# Patient Record
Sex: Male | Born: 1980 | Race: Black or African American | Hispanic: No | Marital: Married | Smoking: Current every day smoker
Health system: Southern US, Community
[De-identification: ages and names within clinical notes are randomized; demographics above are authoritative.]

## PROBLEM LIST (undated history)

## (undated) HISTORY — PX: LUNG SURGERY: SHX703

---
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
## 2002-03-02 ENCOUNTER — Emergency Department (HOSPITAL_COMMUNITY): Admission: EM | Admit: 2002-03-02 | Discharge: 2002-03-02 | Payer: Self-pay | Admitting: Emergency Medicine

## 2002-03-02 ENCOUNTER — Encounter: Payer: Self-pay | Admitting: Emergency Medicine

## 2002-12-04 ENCOUNTER — Emergency Department (HOSPITAL_COMMUNITY): Admission: EM | Admit: 2002-12-04 | Discharge: 2002-12-04 | Payer: Self-pay

## 2002-12-05 ENCOUNTER — Emergency Department (HOSPITAL_COMMUNITY): Admission: EM | Admit: 2002-12-05 | Discharge: 2002-12-06 | Payer: Self-pay | Admitting: Emergency Medicine

## 2003-01-17 ENCOUNTER — Emergency Department (HOSPITAL_COMMUNITY): Admission: AD | Admit: 2003-01-17 | Discharge: 2003-01-17 | Payer: Self-pay | Admitting: Family Medicine

## 2003-04-10 ENCOUNTER — Emergency Department (HOSPITAL_COMMUNITY): Admission: EM | Admit: 2003-04-10 | Discharge: 2003-04-10 | Payer: Self-pay

## 2003-05-05 ENCOUNTER — Emergency Department (HOSPITAL_COMMUNITY): Admission: EM | Admit: 2003-05-05 | Discharge: 2003-05-05 | Payer: Self-pay | Admitting: Emergency Medicine

## 2003-08-04 ENCOUNTER — Emergency Department (HOSPITAL_COMMUNITY): Admission: EM | Admit: 2003-08-04 | Discharge: 2003-08-04 | Payer: Self-pay | Admitting: Emergency Medicine

## 2004-11-25 ENCOUNTER — Emergency Department (HOSPITAL_COMMUNITY): Admission: EM | Admit: 2004-11-25 | Discharge: 2004-11-25 | Payer: Self-pay | Admitting: Emergency Medicine

## 2004-12-11 ENCOUNTER — Emergency Department (HOSPITAL_COMMUNITY): Admission: EM | Admit: 2004-12-11 | Discharge: 2004-12-11 | Payer: Self-pay | Admitting: Emergency Medicine

## 2005-05-21 ENCOUNTER — Emergency Department (HOSPITAL_COMMUNITY): Admission: EM | Admit: 2005-05-21 | Discharge: 2005-05-21 | Payer: Self-pay | Admitting: Emergency Medicine

## 2006-01-19 ENCOUNTER — Emergency Department (HOSPITAL_COMMUNITY): Admission: EM | Admit: 2006-01-19 | Discharge: 2006-01-19 | Payer: Self-pay | Admitting: Emergency Medicine

## 2006-03-25 ENCOUNTER — Emergency Department (HOSPITAL_COMMUNITY): Admission: EM | Admit: 2006-03-25 | Discharge: 2006-03-25 | Payer: Self-pay | Admitting: Emergency Medicine

## 2006-03-27 ENCOUNTER — Emergency Department (HOSPITAL_COMMUNITY): Admission: EM | Admit: 2006-03-27 | Discharge: 2006-03-27 | Payer: Self-pay | Admitting: Emergency Medicine

## 2006-08-22 ENCOUNTER — Emergency Department (HOSPITAL_COMMUNITY): Admission: EM | Admit: 2006-08-22 | Discharge: 2006-08-22 | Payer: Self-pay | Admitting: Emergency Medicine

## 2006-09-01 ENCOUNTER — Emergency Department (HOSPITAL_COMMUNITY): Admission: EM | Admit: 2006-09-01 | Discharge: 2006-09-02 | Payer: Self-pay | Admitting: Emergency Medicine

## 2006-12-02 ENCOUNTER — Emergency Department (HOSPITAL_COMMUNITY): Admission: EM | Admit: 2006-12-02 | Discharge: 2006-12-02 | Payer: Self-pay | Admitting: Emergency Medicine

## 2007-02-05 ENCOUNTER — Emergency Department (HOSPITAL_COMMUNITY): Admission: EM | Admit: 2007-02-05 | Discharge: 2007-02-05 | Payer: Self-pay | Admitting: Emergency Medicine
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
## 2009-02-16 ENCOUNTER — Emergency Department (HOSPITAL_COMMUNITY): Admission: EM | Admit: 2009-02-16 | Discharge: 2009-02-17 | Payer: Self-pay | Admitting: Emergency Medicine

## 2009-03-09 ENCOUNTER — Emergency Department (HOSPITAL_COMMUNITY): Admission: EM | Admit: 2009-03-09 | Discharge: 2009-03-09 | Payer: Self-pay

## 2009-03-22 ENCOUNTER — Encounter: Admission: RE | Admit: 2009-03-22 | Discharge: 2009-03-22 | Payer: Self-pay | Admitting: Chiropractic Medicine

## 2009-07-08 ENCOUNTER — Emergency Department (HOSPITAL_COMMUNITY): Admission: EM | Admit: 2009-07-08 | Discharge: 2009-07-09 | Payer: Self-pay | Admitting: Emergency Medicine

## 2009-10-05 ENCOUNTER — Emergency Department (HOSPITAL_COMMUNITY): Admission: EM | Admit: 2009-10-05 | Discharge: 2009-10-05 | Payer: Self-pay | Admitting: Emergency Medicine

## 2009-10-19 ENCOUNTER — Emergency Department (HOSPITAL_COMMUNITY): Admission: EM | Admit: 2009-10-19 | Discharge: 2009-10-19 | Payer: Self-pay | Admitting: Emergency Medicine

## 2010-02-03 ENCOUNTER — Emergency Department (HOSPITAL_COMMUNITY)
Admission: EM | Admit: 2010-02-03 | Discharge: 2010-02-03 | Payer: Self-pay | Source: Home / Self Care | Admitting: Emergency Medicine

## 2010-02-05 ENCOUNTER — Emergency Department (HOSPITAL_COMMUNITY)
Admission: EM | Admit: 2010-02-05 | Discharge: 2010-02-05 | Payer: Self-pay | Source: Home / Self Care | Admitting: Emergency Medicine

## 2010-02-12 LAB — COMPREHENSIVE METABOLIC PANEL
ALT: 11 U/L (ref 0–53)
AST: 23 U/L (ref 0–37)
Albumin: 5 g/dL (ref 3.5–5.2)
Alkaline Phosphatase: 53 U/L (ref 39–117)
BUN: 8 mg/dL (ref 6–23)
CO2: 27 mEq/L (ref 19–32)
Calcium: 9.6 mg/dL (ref 8.4–10.5)
Chloride: 104 mEq/L (ref 96–112)
Creatinine, Ser: 1.17 mg/dL (ref 0.4–1.5)
GFR calc Af Amer: >60 mL/min (ref >60)
GFR calc non Af Amer: >60 mL/min (ref >60)
Glucose, Bld: 95 mg/dL (ref 70–99)
Potassium: 3.9 mEq/L (ref 3.5–5.1)
Sodium: 139 mEq/L (ref 135–145)
Total Bilirubin: 0.8 mg/dL (ref 0.3–1.2)
Total Protein: 7.9 g/dL (ref 6.0–8.3)

## 2010-02-12 LAB — URINALYSIS, ROUTINE W REFLEX MICROSCOPIC
Bilirubin Urine: NEGATIVE
Hgb urine dipstick: NEGATIVE
Ketones, ur: NEGATIVE mg/dL
Nitrite: NEGATIVE
Protein, ur: NEGATIVE mg/dL
Specific Gravity, Urine: 1.016 (ref 1.005–1.030)
Urine Glucose, Fasting: NEGATIVE mg/dL
Urobilinogen, UA: 1 mg/dL (ref 0.0–1.0)
pH: 5.5 (ref 5.0–8.0)

## 2010-02-12 LAB — DIFFERENTIAL
Basophils Absolute: 0 10*3/uL (ref 0.0–0.1)
Basophils Relative: 0 % (ref 0–1)
Eosinophils Absolute: 0.1 10*3/uL (ref 0.0–0.7)
Eosinophils Relative: 1 % (ref 0–5)
Lymphocytes Relative: 33 % (ref 12–46)
Lymphs Abs: 2.3 10*3/uL (ref 0.7–4.0)
Monocytes Absolute: 0.7 10*3/uL (ref 0.1–1.0)
Monocytes Relative: 10 % (ref 3–12)
Neutro Abs: 4 10*3/uL (ref 1.7–7.7)
Neutrophils Relative %: 56 % (ref 43–77)

## 2010-02-12 LAB — HEMOCCULT GUIAC POC 1CARD (OFFICE): Fecal Occult Bld: POSITIVE

## 2010-02-12 LAB — CBC
HCT: 43.1 % (ref 39.0–52.0)
Hemoglobin: 14.3 g/dL (ref 13.0–17.0)
MCH: 28.6 pg (ref 26.0–34.0)
MCHC: 33.2 g/dL (ref 30.0–36.0)
MCV: 86.2 fL (ref 78.0–100.0)
Platelets: 214 10*3/uL (ref 150–400)
RBC: 5 MIL/uL (ref 4.22–5.81)
RDW: 13.8 % (ref 11.5–15.5)
WBC: 7.1 10*3/uL (ref 4.0–10.5)

## 2010-02-12 LAB — LIPASE, BLOOD: Lipase: 28 U/L (ref 11–59)

## 2010-03-21 ENCOUNTER — Emergency Department (HOSPITAL_COMMUNITY)
Admission: EM | Admit: 2010-03-21 | Discharge: 2010-03-21 | Disposition: A | Discharge status: Home / Self Care | Payer: Self-pay | Attending: Emergency Medicine | Admitting: Emergency Medicine

## 2010-03-21 DIAGNOSIS — K089 Disorder of teeth and supporting structures, unspecified: Secondary | ICD-10-CM | POA: Insufficient documentation

## 2010-03-21 DIAGNOSIS — K029 Dental caries, unspecified: Secondary | ICD-10-CM | POA: Insufficient documentation

## 2010-04-02 ENCOUNTER — Emergency Department (HOSPITAL_COMMUNITY)
Admission: EM | Admit: 2010-04-02 | Discharge: 2010-04-02 | Disposition: A | Discharge status: Home / Self Care | Payer: Self-pay | Attending: Emergency Medicine | Admitting: Emergency Medicine

## 2010-04-12 LAB — URINALYSIS, ROUTINE W REFLEX MICROSCOPIC
Bilirubin Urine: NEGATIVE
Ketones, ur: NEGATIVE mg/dL
Nitrite: NEGATIVE
Protein, ur: NEGATIVE mg/dL
Specific Gravity, Urine: 1.021 (ref 1.005–1.030)
Urobilinogen, UA: 1 mg/dL (ref 0.0–1.0)

## 2010-04-12 LAB — GC/CHLAMYDIA PROBE AMP, GENITAL
Chlamydia, DNA Probe: NEGATIVE
GC Probe Amp, Genital: NEGATIVE

## 2010-04-12 LAB — URINE CULTURE: Culture: NO GROWTH

## 2010-04-15 LAB — URINALYSIS, ROUTINE W REFLEX MICROSCOPIC
Glucose, UA: NEGATIVE mg/dL
Hgb urine dipstick: NEGATIVE
Ketones, ur: NEGATIVE mg/dL
Nitrite: NEGATIVE
Protein, ur: NEGATIVE mg/dL
Specific Gravity, Urine: 1.028 (ref 1.005–1.030)
Urobilinogen, UA: 1 mg/dL (ref 0.0–1.0)
pH: 6.5 (ref 5.0–8.0)

## 2010-04-15 LAB — DIFFERENTIAL
Basophils Absolute: 0 10*3/uL (ref 0.0–0.1)
Basophils Relative: 1 % (ref 0–1)
Eosinophils Relative: 1 % (ref 0–5)
Lymphocytes Relative: 49 % — ABNORMAL HIGH (ref 12–46)
Monocytes Absolute: 0.5 10*3/uL (ref 0.1–1.0)
Monocytes Relative: 7 % (ref 3–12)
Neutro Abs: 3 10*3/uL (ref 1.7–7.7)

## 2010-04-15 LAB — CBC
HCT: 45.2 % (ref 39.0–52.0)
Hemoglobin: 15 g/dL (ref 13.0–17.0)
MCHC: 33.2 g/dL (ref 30.0–36.0)
MCV: 88.2 fL (ref 78.0–100.0)
Platelets: 193 10*3/uL (ref 150–400)
RBC: 5.12 MIL/uL (ref 4.22–5.81)
RDW: 13.4 % (ref 11.5–15.5)
WBC: 7.1 10*3/uL (ref 4.0–10.5)

## 2010-04-15 LAB — BASIC METABOLIC PANEL
CO2: 25 mEq/L (ref 19–32)
Calcium: 9.6 mg/dL (ref 8.4–10.5)
GFR calc Af Amer: >60 mL/min (ref >60)
Glucose, Bld: 84 mg/dL (ref 70–99)
Potassium: 3.2 mEq/L — ABNORMAL LOW (ref 3.5–5.1)
Sodium: 139 mEq/L (ref 135–145)

## 2010-04-15 LAB — URINE MICROSCOPIC-ADD ON

## 2010-04-15 LAB — GC/CHLAMYDIA PROBE AMP, GENITAL: GC Probe Amp, Genital: NEGATIVE

## 2010-04-15 LAB — URINE CULTURE
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
## 2010-06-15 NOTE — Op Note (Signed)
NAME:  George Butler, George Butler NO.:  [id number]   MEDICAL RECORD NO.:  [id number]                   PATIENT TYPE:  INP   LOCATION:  3308                                 FACILITY:  [facility]   PHYSICIAN:  Ines Bloomer, M.D.              DATE OF BIRTH:  January 02, 1981   DATE OF PROCEDURE:  DATE OF DISCHARGE:                                 OPERATIVE REPORT   PREOPERATIVE DIAGNOSIS:  Persistent air leak secondary to apical blebs.   POSTOPERATIVE DIAGNOSIS:  Persistent air leak secondary to apical blebs.   OPERATION PERFORMED:  Right VATS and resection of apical blebs, pleurectomy  and pleurodesis.   SURGEON:  Ines Bloomer, M.D.   FIRST ASSISTANT:  Claudette TNils Flack, C.R.N.A.   DESCRIPTION OF PROCEDURE:  After percutaneous insertion of all monitor  lines, the patient underwent general anesthesia and placed in the right  lateral thoracotomy position.  The previous chest tube had been removed.  He  had come in with a 100% right pneumothorax and had a persistent air leak  over 4 days.  His CT scan questioned that there was an apical bleb.  The  patient was prepped and draped in the usual sterile manner.  The lung was  collapsed and two trochar sites were made, one using the old chest tube site  and the other one laterally in the fifth posterior axillary line fifth  intercostal space.  Two trocars were inserted.  30 degree scope was inserted  and a 2S forceps was inserted medially and grasped the upper lobe and on the  top of the upper lobe you can see two areas of blebs.  Trochar site was made  at the anterior axillary line at fifth intercostal space.  The trocars were  inserted and through the upper trochar was inserted a Kaiser ring forceps  grasping the lesion and through the lower trochar site was brought in an  EZ  45 stapler and this was resected with two applications of vapor, it was  removed.  Then using Kaiser ring forceps we rotated the camera from  one  trochar site to the other.  A six or seven rib upper pleurectomy was  performed and then using a Bovie scraper, pleurodesis was also performed at  the lower portion of the seventh, eighth, and ninth ribs ______ with the  Bovie scraper.  We checked for air leak and none was found.  A chest tube  was then inserted through the two lower trochar sites and tied with  _______________silk was done in the usual fashion, the third trochar site  was closed with 2-0 Vicryl and 3-0 was used for subcuticular stitch and  Dermabond for the skin.  The patient was returned to the recovery room in  stable condition.  Ines Bloomer, M.D.    DPB/MEDQ  D:  10/16/2001  T:  10/19/2001  Job:  [id number]

## 2011-01-01 ENCOUNTER — Emergency Department (HOSPITAL_COMMUNITY)
Admission: EM | Admit: 2011-01-01 | Discharge: 2011-01-01 | Disposition: A | Discharge status: Home / Self Care | Payer: Self-pay | Attending: Emergency Medicine | Admitting: Emergency Medicine

## 2011-01-01 ENCOUNTER — Encounter: Payer: Self-pay | Admitting: *Deleted

## 2011-01-01 DIAGNOSIS — R220 Localized swelling, mass and lump, head: Secondary | ICD-10-CM | POA: Insufficient documentation

## 2011-01-01 DIAGNOSIS — K029 Dental caries, unspecified: Secondary | ICD-10-CM | POA: Insufficient documentation

## 2011-01-01 DIAGNOSIS — R221 Localized swelling, mass and lump, neck: Secondary | ICD-10-CM | POA: Insufficient documentation

## 2011-01-01 DIAGNOSIS — R109 Unspecified abdominal pain: Secondary | ICD-10-CM | POA: Insufficient documentation

## 2011-01-01 DIAGNOSIS — R112 Nausea with vomiting, unspecified: Secondary | ICD-10-CM | POA: Insufficient documentation

## 2011-01-01 DIAGNOSIS — F17200 Nicotine dependence, unspecified, uncomplicated: Secondary | ICD-10-CM | POA: Insufficient documentation

## 2011-01-01 DIAGNOSIS — K047 Periapical abscess without sinus: Secondary | ICD-10-CM | POA: Insufficient documentation

## 2011-01-01 MED ORDER — ONDANSETRON 4 MG PO TBDP
4.0000 mg | ORAL_TABLET | Freq: Once | ORAL | Status: AC
  Administered 2011-01-01: 4 mg via ORAL
  Filled 2011-01-01: qty 1

## 2011-01-01 MED ORDER — AMOXICILLIN 500 MG PO CAPS: 500.0000 mg | ORAL_CAPSULE | Freq: Three times a day (TID) | ORAL | Status: AC

## 2011-01-01 MED ORDER — OXYCODONE-ACETAMINOPHEN 5-325 MG PO TABS: 2.0000 | ORAL_TABLET | ORAL | Status: AC | PRN

## 2011-01-01 MED ORDER — ONDANSETRON HCL 4 MG PO TABS: 4.0000 mg | ORAL_TABLET | Freq: Four times a day (QID) | ORAL | Status: AC

## 2011-01-01 MED ORDER — AMOXICILLIN 500 MG PO CAPS
1000.0000 mg | ORAL_CAPSULE | Freq: Once | ORAL | Status: AC
  Administered 2011-01-01: 1000 mg via ORAL
  Filled 2011-01-01: qty 2

## 2011-01-01 NOTE — ED Notes (Signed)
Pt states he thinks he had a dental abscess for the last couple days and was rinsing out his mouth with peroxide, abscess was causing facial swelling. States this am woke up and he thinks abscess had busted and now he is having abdominal pain and vomiting. Denies any fevers.

## 2011-01-01 NOTE — ED Provider Notes (Signed)
History     CSN: [id number] Arrival date & time: 01/01/2011  1:05 PM   First MD Initiated Contact with Patient 01/01/11 1355      Chief Complaint  Patient presents with  . Oral Swelling  . Abdominal Pain  . Emesis   pleasant 30 year old gentleman with no past medical history. He reports dental pain and swelling over the past several days in the left lower molar area. He states his face was swollen. This morning and he feels that there was some drainage from this area. The patient thinks he may have swallowed some of the pus or drainage, which caused some nausea. This morning. However, he has had no fevers no abdominal pain, and states the swelling has improved significantly. This morning. Patient has not taken any medications prior to arrival. Denies any difficulty breathing or swallowing. Patient denies any drug allergies other than codeine  (Consider location/radiation/quality/duration/timing/severity/associated sxs/prior treatment) HPI  No past medical history on file.  No past surgical history on file.  No family history on file.  History  Substance Use Topics  . Smoking status: Current Everyday Smoker  . Smokeless tobacco: Not on file  . Alcohol Use: Yes      Review of Systems  All other systems reviewed and are negative.    Allergies  Codeine  Home Medications  No current outpatient prescriptions on file.  BP 122/74  Pulse 94  Temp(Src) 98.1 F (36.7 C) (Oral)  Resp 20  SpO2 100%  Physical Exam  Nursing note and vitals reviewed. Constitutional: He is oriented to person, place, and time. He appears well-developed and well-nourished.  HENT:  Head: Normocephalic and atraumatic.  Mouth/Throat: Oropharynx is clear and moist. No oropharyngeal exudate.       Airway is patent, tongue is soft. There is discoloration and obvious dental caries in the left lower molar area. Mild adjacent redness and gingival swelling. Question mild dental abscess. There is no facial  swelling. No crepitus. Neck is soft with no induration.  Eyes: Conjunctivae and EOM are normal. Pupils are equal, round, and reactive to light.  Neck: Neck supple.  Cardiovascular: Normal rate and regular rhythm.  Exam reveals no gallop and no friction rub.   No murmur heard. Pulmonary/Chest: Breath sounds normal. He has no wheezes. He has no rales. He exhibits no tenderness.  Abdominal: Soft. Bowel sounds are normal. He exhibits no distension. There is no tenderness. There is no rebound and no guarding.  Musculoskeletal: Normal range of motion.  Neurological: He is alert and oriented to person, place, and time. No cranial nerve deficit. Coordination normal.  Skin: Skin is warm and dry. No rash noted.  Psychiatric: He has a normal mood and affect.    ED Course  Procedures (including critical care time)  Labs Reviewed - No data to display No results found.   No diagnosis found.    MDM  Pt is seen and examined;  Initial history and physical completed.  Will follow.          George Butler A. Patrica Duel, MD 01/01/11 1406

## 2011-01-06 ENCOUNTER — Encounter (HOSPITAL_COMMUNITY): Payer: Self-pay | Admitting: *Deleted

## 2011-01-06 ENCOUNTER — Emergency Department (HOSPITAL_COMMUNITY)
Admission: EM | Admit: 2011-01-06 | Discharge: 2011-01-06 | Disposition: A | Discharge status: Home / Self Care | Payer: Self-pay | Attending: Emergency Medicine | Admitting: Emergency Medicine

## 2011-01-06 DIAGNOSIS — F17200 Nicotine dependence, unspecified, uncomplicated: Secondary | ICD-10-CM | POA: Insufficient documentation

## 2011-01-06 DIAGNOSIS — R10819 Abdominal tenderness, unspecified site: Secondary | ICD-10-CM | POA: Insufficient documentation

## 2011-01-06 DIAGNOSIS — IMO0001 Reserved for inherently not codable concepts without codable children: Secondary | ICD-10-CM | POA: Insufficient documentation

## 2011-01-06 DIAGNOSIS — Z79899 Other long term (current) drug therapy: Secondary | ICD-10-CM | POA: Insufficient documentation

## 2011-01-06 DIAGNOSIS — R51 Headache: Secondary | ICD-10-CM | POA: Insufficient documentation

## 2011-01-06 DIAGNOSIS — R110 Nausea: Secondary | ICD-10-CM | POA: Insufficient documentation

## 2011-01-06 DIAGNOSIS — R6889 Other general symptoms and signs: Secondary | ICD-10-CM | POA: Insufficient documentation

## 2011-01-06 DIAGNOSIS — R109 Unspecified abdominal pain: Secondary | ICD-10-CM | POA: Insufficient documentation

## 2011-01-06 MED ORDER — SODIUM CHLORIDE 0.9 % IV BOLUS (SEPSIS): 1000.0000 mL | Freq: Once | INTRAVENOUS | Status: DC

## 2011-01-06 MED ORDER — OSELTAMIVIR PHOSPHATE 75 MG PO CAPS: 75.0000 mg | ORAL_CAPSULE | Freq: Two times a day (BID) | ORAL | Status: AC

## 2011-01-06 MED ORDER — ONDANSETRON HCL 4 MG/2ML IJ SOLN
4.0000 mg | Freq: Once | INTRAMUSCULAR | Status: AC
  Administered 2011-01-06: 4 mg via INTRAVENOUS
  Filled 2011-01-06: qty 2

## 2011-01-06 MED ORDER — SODIUM CHLORIDE 0.9 % IV BOLUS (SEPSIS)
1000.0000 mL | Freq: Once | INTRAVENOUS | Status: AC
  Administered 2011-01-06: 1000 mL via INTRAVENOUS

## 2011-01-06 MED ORDER — KETOROLAC TROMETHAMINE 30 MG/ML IJ SOLN
30.0000 mg | Freq: Once | INTRAMUSCULAR | Status: AC
  Administered 2011-01-06: 30 mg via INTRAVENOUS
  Filled 2011-01-06: qty 1

## 2011-01-06 MED ORDER — IBUPROFEN 800 MG PO TABS: 800.0000 mg | ORAL_TABLET | Freq: Three times a day (TID) | ORAL | Status: AC

## 2011-01-06 NOTE — ED Notes (Signed)
Reports abd pain, n/v, headache, body aches, chills. Mask on pt at triage.

## 2011-01-06 NOTE — ED Notes (Signed)
Patient denies pain and is resting comfortably.  

## 2011-01-06 NOTE — ED Notes (Signed)
Pt states cough aching and flu s/s  X 2 days

## 2011-01-06 NOTE — ED Provider Notes (Signed)
History     CSN: [id number] Arrival date & time: 01/06/2011  1:16 PM   First MD Initiated Contact with Patient 01/06/11 1505      Chief Complaint  Patient presents with  . Influenza     HPI  George Butler previous healthy presents with multiple complaints. Patient states that 2 days ago he began to feel diffuse body aches, chills, subjective fevers. He complains of diffuse headache assessment sinusitis. He complains of nausea and states that he's had multiple episodes of nonbilious nonbloody emesis. He states that he has had a cough. He feels diffuse abdominal pain which is worse with lying down and sitting up, using his abdominal muscles. He feels that the pain is secondary to coughing. No sick contacts. Denies hematuria/dysuria/freq/urgency. He denies flu shot this season.  ED Notes, ED Provider Notes from 01/06/11 0000 to 01/06/11 13:31:05       Arta Silence, RN 01/06/2011 13:29      Reports abd pain, n/v, headache, body aches, chills. Mask on pt at triage.    History reviewed. No pertinent past medical history.  History reviewed. No pertinent past surgical history.  History reviewed. No pertinent family history.  History  Substance Use Topics  . Smoking status: Current Everyday Smoker  . Smokeless tobacco: Not on file  . Alcohol Use: Yes    Review of Systems  All other systems reviewed and are negative.  except as noted HPI   Allergies  Codeine  Home Medications   Current Outpatient Rx  Name Route Sig Dispense Refill  . AMOXICILLIN 500 MG PO CAPS Oral Take 1 capsule (500 mg total) by mouth 3 (three) times daily. 21 capsule 0  . ONDANSETRON HCL 4 MG PO TABS Oral Take 1 tablet (4 mg total) by mouth every 6 (six) hours. 12 tablet 0  . OXYCODONE-ACETAMINOPHEN 5-325 MG PO TABS Oral Take 2 tablets by mouth every 4 (four) hours as needed for pain. 15 tablet 0  . IBUPROFEN 800 MG PO TABS Oral Take 1 tablet (800 mg total) by mouth 3 (three) times daily. 21 tablet 0  .  OSELTAMIVIR PHOSPHATE 75 MG PO CAPS Oral Take 1 capsule (75 mg total) by mouth every 12 (twelve) hours. 10 capsule 0    BP 132/81  Pulse 98  Temp(Src) 99.6 F (37.6 C) (Oral)  Resp 20  SpO2 99%  Physical Exam  Nursing note and vitals reviewed. Constitutional: He is oriented to person, place, and time. He appears well-developed and well-nourished. No distress.  HENT:  Head: Atraumatic.  Mouth/Throat: Oropharynx is clear and moist.  Eyes: Conjunctivae are normal. Pupils are equal, round, and reactive to light.  Neck: Neck supple.  Cardiovascular: Normal rate, regular rhythm, normal heart sounds and intact distal pulses.  Exam reveals no gallop and no friction rub.   No murmur heard. Pulmonary/Chest: Effort normal. No respiratory distress. He has no wheezes. He has no rales. He exhibits no tenderness.  Abdominal: Soft. Bowel sounds are normal. There is tenderness. There is no rebound and no guarding.       Mild diffuse ttp, worse with abd flexion  Musculoskeletal: Normal range of motion. He exhibits no edema and no tenderness.  Neurological: He is alert and oriented to person, place, and time.  Skin: Skin is warm and dry.  Psychiatric: He has a normal mood and affect.    ED Course  Procedures (including critical care time)  Labs Reviewed - No data to display No results found.  1. Flu-like symptoms     MDM  The patient has a flulike illness. He is immunocompetent. His symptoms started approximately 48 hours ago. He appears uncomfortable here and therefore treated with normal saline bolus, Zofran, Toradol. Home with Tamiflu and ibuprofen. Reassess.  States that he is ready to go home. Patient given precautions for return.        Forbes Cellar, MD 01/06/11 1642

## 2011-02-21 ENCOUNTER — Encounter (HOSPITAL_COMMUNITY): Payer: Self-pay | Admitting: Emergency Medicine

## 2011-02-21 ENCOUNTER — Emergency Department (HOSPITAL_COMMUNITY)
Admission: EM | Admit: 2011-02-21 | Discharge: 2011-02-21 | Disposition: A | Discharge status: Home / Self Care | Payer: Self-pay | Attending: Emergency Medicine | Admitting: Emergency Medicine

## 2011-02-21 DIAGNOSIS — K6289 Other specified diseases of anus and rectum: Secondary | ICD-10-CM | POA: Insufficient documentation

## 2011-02-21 DIAGNOSIS — N419 Inflammatory disease of prostate, unspecified: Secondary | ICD-10-CM | POA: Insufficient documentation

## 2011-02-21 DIAGNOSIS — R110 Nausea: Secondary | ICD-10-CM | POA: Insufficient documentation

## 2011-02-21 DIAGNOSIS — R350 Frequency of micturition: Secondary | ICD-10-CM | POA: Insufficient documentation

## 2011-02-21 DIAGNOSIS — F17200 Nicotine dependence, unspecified, uncomplicated: Secondary | ICD-10-CM | POA: Insufficient documentation

## 2011-02-21 DIAGNOSIS — M549 Dorsalgia, unspecified: Secondary | ICD-10-CM | POA: Insufficient documentation

## 2011-02-21 LAB — URINALYSIS, ROUTINE W REFLEX MICROSCOPIC
Glucose, UA: NEGATIVE mg/dL
Leukocytes, UA: NEGATIVE
Nitrite: NEGATIVE
Specific Gravity, Urine: 1.036 — ABNORMAL HIGH (ref 1.005–1.030)
pH: 5.5 (ref 5.0–8.0)

## 2011-02-21 LAB — URINE MICROSCOPIC-ADD ON

## 2011-02-21 MED ORDER — CEFTRIAXONE SODIUM 250 MG IJ SOLR
250.0000 mg | Freq: Once | INTRAMUSCULAR | Status: AC
  Administered 2011-02-21: 250 mg via INTRAMUSCULAR
  Filled 2011-02-21: qty 250

## 2011-02-21 MED ORDER — DOXYCYCLINE HYCLATE 100 MG PO TABS
100.0000 mg | ORAL_TABLET | Freq: Once | ORAL | Status: AC
  Administered 2011-02-21: 100 mg via ORAL
  Filled 2011-02-21: qty 1

## 2011-02-21 MED ORDER — DOXYCYCLINE HYCLATE 100 MG PO CAPS: 100.0000 mg | ORAL_CAPSULE | Freq: Two times a day (BID) | ORAL | Status: AC

## 2011-02-21 MED ORDER — LIDOCAINE HCL (PF) 1 % IJ SOLN
INTRAMUSCULAR | Status: AC
  Administered 2011-02-21: 1 mL
  Filled 2011-02-21: qty 5

## 2011-02-21 NOTE — ED Notes (Signed)
PT. REPORTS LOW BACK PAIN FOR 3 DAYS WITH DYSURIA "HURTS TO PEE" ONSET TODAY WITH NAUSEA.

## 2011-02-21 NOTE — ED Provider Notes (Signed)
History     CSN: [id number]  Arrival date & time 02/21/11  2028   First MD Initiated Contact with Patient 02/21/11 2048      Chief Complaint  Patient presents with  . Back Pain    (Consider location/radiation/quality/duration/timing/severity/associated sxs/prior treatment) Patient is a 31 y.o. male presenting with dysuria. The history is provided by the patient.  Dysuria  This is a new problem. The current episode started 12 to 24 hours ago. The problem occurs every urination. The problem has not changed since onset.The quality of the pain is described as burning and stabbing. The pain is at a severity of 6/10. The pain is moderate. There has been no fever. There is no history of pyelonephritis. Associated symptoms include nausea and frequency. Pertinent negatives include no vomiting, no discharge and no flank pain. Associated symptoms comments: Rectal pain. He has tried nothing for the symptoms. Past medical history comments: History of prostatitis.    History reviewed. No pertinent past medical history.  History reviewed. No pertinent past surgical history.  No family history on file.  History  Substance Use Topics  . Smoking status: Current Everyday Smoker  . Smokeless tobacco: Not on file  . Alcohol Use: Yes      Review of Systems  Gastrointestinal: Positive for nausea. Negative for vomiting.  Genitourinary: Positive for frequency. Negative for flank pain.  All other systems reviewed and are negative.    Allergies  Codeine and Shellfish allergy  Home Medications  No current outpatient prescriptions on file.  BP 130/79  Pulse 97  Temp(Src) 97.4 F (36.3 C) (Oral)  Resp 16  SpO2 99%  Physical Exam  Nursing note and vitals reviewed. Constitutional: He is oriented to person, place, and time. He appears well-developed and well-nourished. No distress.  HENT:  Head: Normocephalic and atraumatic.  Mouth/Throat: Oropharynx is clear and moist.  Eyes:  Conjunctivae and EOM are normal. Pupils are equal, round, and reactive to light.  Neck: Normal range of motion. Neck supple.  Cardiovascular: Normal rate, regular rhythm and intact distal pulses.   No murmur heard. Pulmonary/Chest: Effort normal and breath sounds normal. No respiratory distress. He has no wheezes. He has no rales.  Abdominal: Soft. He exhibits no distension. There is no tenderness. There is no rebound and no guarding.  Genitourinary: Prostate is enlarged and tender.  Musculoskeletal: Normal range of motion. He exhibits no edema and no tenderness.       Lumbar back: He exhibits tenderness. He exhibits normal range of motion and no bony tenderness.       Tenderness to bilateral paralumbar spine  Neurological: He is alert and oriented to person, place, and time.  Skin: Skin is warm and dry. No rash noted. No erythema.  Psychiatric: He has a normal mood and affect. His behavior is normal.    ED Course  Procedures (including critical care time)  Labs Reviewed  URINALYSIS, ROUTINE W REFLEX MICROSCOPIC - Abnormal; Notable for the following:    Color, Urine AMBER (*) BIOCHEMICALS MAY BE AFFECTED BY COLOR   APPearance CLOUDY (*)    Specific Gravity, Urine 1.036 (*)    Ketones, ur 15 (*)    Protein, ur 30 (*)    All other components within normal limits  URINE MICROSCOPIC-ADD ON   No results found.   No diagnosis found.    MDM   Patient with a history of prostatitis about 8 months ago who has had back pain for the last 4 days and  a little bit of nausea and developed dysuria today. He denies any change in sexual partner. He has been married and with the same partner for years. He denies any penile drainage.  Severe tenderness over the prostate. UA here is normal and low risk for STD given he only has one partner and he has been married for years without any concern. Given the severe prostate tenderness will treat for prostatitis with rocephin and doxy based on age.           Gwyneth Sprout, MD 02/21/11 2244

## 2011-04-05 ENCOUNTER — Encounter (HOSPITAL_COMMUNITY): Payer: Self-pay | Admitting: Emergency Medicine

## 2011-04-05 ENCOUNTER — Emergency Department (HOSPITAL_COMMUNITY)
Admission: EM | Admit: 2011-04-05 | Discharge: 2011-04-05 | Disposition: A | Discharge status: Home / Self Care | Payer: Self-pay | Attending: Emergency Medicine | Admitting: Emergency Medicine

## 2011-04-05 DIAGNOSIS — F17200 Nicotine dependence, unspecified, uncomplicated: Secondary | ICD-10-CM | POA: Insufficient documentation

## 2011-04-05 DIAGNOSIS — K029 Dental caries, unspecified: Secondary | ICD-10-CM | POA: Insufficient documentation

## 2011-04-05 MED ORDER — PENICILLIN V POTASSIUM 500 MG PO TABS: 500.0000 mg | ORAL_TABLET | Freq: Three times a day (TID) | ORAL | Status: AC

## 2011-04-05 MED ORDER — OXYCODONE-ACETAMINOPHEN 5-325 MG PO TABS: 2.0000 | ORAL_TABLET | ORAL | Status: AC | PRN

## 2011-04-05 MED ORDER — OXYCODONE-ACETAMINOPHEN 5-325 MG PO TABS
2.0000 | ORAL_TABLET | Freq: Once | ORAL | Status: AC
  Administered 2011-04-05: 2 via ORAL
  Filled 2011-04-05: qty 2

## 2011-04-05 NOTE — ED Notes (Signed)
Per pt: left lower jaw last tooth is painful, large cary and broken tooth noted. Pt unable to find oral surgeon. Pt here due to pain and onset of sore throat, started last night.

## 2011-04-05 NOTE — ED Provider Notes (Signed)
History     CSN: [id number]  Arrival date & time 04/05/11  1757   First MD Initiated Contact with Patient 04/05/11 1846      Chief Complaint  Patient presents with  . Abcessed Tooth     (Consider location/radiation/quality/duration/timing/severity/associated sxs/prior treatment) Patient is a 31 y.o. male presenting with tooth pain. The history is provided by the patient. No language interpreter was used.  Dental PainThe symptoms began 5 to 7 days ago. The symptoms are worsening. The symptoms are recurrent.  Additional symptoms include: gum tenderness and excessive salivation. Additional symptoms do not include: gum swelling, purulent gums, trismus, jaw pain, facial swelling, trouble swallowing, taste disturbance, drooling, ear pain and swollen glands. Medical issues do not include: alcohol problem and smoking.   Reports left lower molar pain times more than 6 days. Patient was here in December for the same pain but does not remember coming here. Has been using on discomforts primary care provider since the year 2000. [facility] provided him with resources to get a primary care physician. Patient also needs an oral surgeon because the tooth that is hurting him isn't down to the gum and the dentist will not pull it. History reviewed. No pertinent past medical history.  History reviewed. No pertinent past surgical history.  History reviewed. No pertinent family history.  History  Substance Use Topics  . Smoking status: Current Everyday Smoker    Types: Cigarettes  . Smokeless tobacco: Not on file  . Alcohol Use: Yes     2-3 shots on the weekend      Review of Systems  HENT: Negative for ear pain, facial swelling, drooling and trouble swallowing.   All other systems reviewed and are negative.    Allergies  Codeine and Shellfish allergy  Home Medications   Current Outpatient Rx  Name Route Sig Dispense Refill  . IBUPROFEN 400 MG PO TABS Oral Take 400 mg  by mouth every 6 (six) hours as needed. For pain      BP 134/87  Pulse 80  Temp(Src) 98.9 F (37.2 C) (Oral)  Resp 18  Ht 5\' 7"  (1.702 m)  Wt 175 lb (79.379 kg)  BMI 27.41 kg/m2  SpO2 100%  Physical Exam  Nursing note and vitals reviewed. Constitutional: He is oriented to person, place, and time. He appears well-developed and well-nourished.  HENT:  Head: Normocephalic and atraumatic. No trismus in the jaw.  Mouth/Throat: Mucous membranes are normal. Dental caries present. No dental abscesses or uvula swelling. No oropharyngeal exudate, posterior oropharyngeal edema, posterior oropharyngeal erythema or tonsillar abscesses.    Eyes: Pupils are equal, round, and reactive to light.  Neck: Normal range of motion.  Cardiovascular: Normal rate.   Pulmonary/Chest: Effort normal and breath sounds normal. No respiratory distress.  Abdominal: He exhibits no distension.  Musculoskeletal: Normal range of motion.  Neurological: He is alert and oriented to person, place, and time. No cranial nerve deficit.  Skin: Skin is warm and dry.  Psychiatric: He has a normal mood and affect.    ED Course  Procedures (including critical care time)  Labs Reviewed - No data to display No results found.   No diagnosis found.    MDM  The current left lower toothache pain. We'll give a few narcotic pills and some penicillin in case it gets infected. No obvious abscess presently. States that his dentist tomorrow May the tooth because it is too close to his gum. Oral surgeon referral given. Explained to  patient he cannot keep coming back here for narcotics.        Jethro Bastos, NP 04/05/11 1921

## 2011-04-05 NOTE — ED Notes (Signed)
NP at bedside.

## 2011-04-05 NOTE — Discharge Instructions (Signed)
George Butler take the penicillin if you start having drainage from your gums or facial swelling.  Get a PCP from the list below.  This is the second time you were here for the same tooth.  We can refer you to an oral surgeon since your dentist will not pull this tooth.  Call Dr Oren Bracket at [phone] to make an appointment for oral surgery if needed.  Dental Extraction A dental extraction procedure refers to a routine tooth extraction performed by your dentist. The procedure depends on where and how the tooth is positioned. The procedure can be very quick, sometimes lasting only seconds. Reasons for dental extraction include:  Tooth decay.   Infections (abcesses).   The need to make room for other teeth.   Gum disease s where the supporting bone has been destroyed.   Fractures of the tooth leaving it unrestorable.   Extra teeth (supernumerary) or grossly malformed teeth.   Baby teeththat have not fallen out in time and have not permitted the the permanent teeth to erupt properly.   In preparation for braces where there is not enough room to align the teeth properly.   Not enough room for wisdom teeth (particularly those that are impacted).   Prior to receiving radiation to the head and neck,teeth in the field of radiation may need to be extracted.  LET YOUR CAREGIVER KNOW ABOUT:  Any allergies.   All medicines you are taking:   Including herbs, eye drops, over-the-counter medications, and creams.   Blood thinners (anticoagulants), aspirin or other drugs that may affect blood clotting.   Use of steroids (through mouth or as creams).   Previous problems with anesthetics, including local anesthetics.   History of bleeding or blood problems.   Previous surgery.   Possibility of pregnancy if this applies.   Smoking history.   Any health problems.  RISKS AND COMPLICATIONS As with any procedure, complications may occur, but they can usually be managed by your caregiver. General  surgical complications may include:  Reaction to anesthesia.   Damage to surrounding teeth, nerves, tissues, or structures.   Infection.   Bleeding.  With appropriate treatment and care after surgery, the following complications are very uncommon:  Dry socket (blood clot does not form or stay in place over empty socket). This can delay healing.   Incomplete extraction of roots.   Jawbone injury, pain, or weakness.  BEFORE THE PROCEDURE  Your dental care provider will:   Take a medical and dental history.   Take an X-ray to evaluate the circumstances and how to best extract the tooth.   Do an oral exam.   Depending on the situation, antibiotics may be given before or after the extraction, or before and after.   Your caregivers may review the procedure, the local anaesthesia and/or sedation being used, and what to expect after the procedure with you.   If needed, your dentist may give you a form of sedation, either by medicine you swallow, gas, or intravenously (IV). This will help to relieve anxiety. Complicated extractions may require the use of general anaesthesia.  It is important to follow your caregiver's instructions prior to your procedure to avoid complications. Steps before your procedure may include:  Alert your caregiver if you feel ill (sore throat, fever, upset stomach, etc.) in the days leading up to your procedure.   Stop taking certain medications for several days prior to your procedure such as blood thinners.   Take certain medications, such as antibiotics.  Avoid eating and drinking for several hours before the procedure. This will help you to avoid complications from the sedation or anaesthesia.   Sign a patient consent form.   Have a friend or family member drive you to the dentist and drive you home after the procedure.   Wear comfortable, loose clothing. Limit makeup and jewelry.   Quit smoking. If you are a smoker, this will raise the chances  of a healing problem after your procedure. If you are thinking about quitting, talk to your surgeon about how long before the operation you should stop smoking. You may also get help from your primary caregiver.  PROCEDURE Dental extraction is typically done as an outpatient procedure. IV sedation, local anesthesia, or both may be used. It will keep you comfortable and free of pain during the procedure.  There are 2 types of extractions:  Simple extraction involves a tooth that is visible in the mouth and above the gum line. After local anesthetic is given by injection, and the area is numbed, the dentist will loosen the tooth with a special instrument (elevator). Then another instrument (forceps) will be used to grasp the tooth and remove it from its socket. During the procedure you will feel some pressure, but you should not feel pain. If you do feel pain, tell your dentist. The open socket will be cleaned. Dressings (gauze) will be placed in the socket to reduce bleeding.   Surgical extractions are used if the tooth has not come into the mouth or the tooth is broken off below the gum line. The dentist will make a cut (incision) in the gum and may have to remove some of the bone around the tooth to aid in the removal of the tooth. After removal, stitches (sutures) may be required to close area to help in healing and control bleeding. For some surgical extractions, you may need a general anesthetic or IV sedation (through the vein).  After both types of extractions, you may be given pain medication or other drugs to help healing. Other post operative instructions will be given by your dental caregiver. AFTER THE PROCEDURE  You will have gauze in your mouth where the tooth was removed. Gentle pressure on the gauze for up to 1 hour will help to control bleeding.   A blood clot will begin to form over the open socket. This is normal. Do not touch the area or rinse it.   Your pain will be controlled  with medication and self-care.   You will be given detailed instructions for care after surgery.  PROGNOSIS While some discomfort is normal after tooth extraction, most patients recover fully in just a few days. SEEK IMMEDIATE DENTAL CARE  You have uncontrolled bleeding, marked swelling, or severe pain.   You develop a fever, difficulty swallowing, or other severe symptoms.   You have questions or concerns.  Document Released: 01/14/2005 Document Revised: 01/03/2011 Document Reviewed: 04/20/2010 [facility] Patient Information 2012 [place], [place].Dental Pain Toothache is pain in or around a tooth. It may get worse with chewing or with cold or heat.  HOME CARE  Your dentist may use a numbing medicine during treatment. If so, you may need to avoid eating until the medicine wears off. Ask your dentist about this.   Only take medicine as told by your dentist or doctor.   Avoid chewing food near the painful tooth until after all treatment is done. Ask your dentist about this.  GET HELP RIGHT AWAY IF:  The problem gets worse or new problems appear.   You have a fever.   There is redness and puffiness (swelling) of the face, jaw, or neck.   You cannot open your mouth.   There is pain in the jaw.   There is very bad pain that is not helped by medicine.  MAKE SURE YOU:   Understand these instructions.   Will watch your condition.   Will get help right away if you are not doing well or get worse.  Document Released: 07/03/2007 Document Revised: 01/03/2011 Document Reviewed: 07/03/2007 [facility] Patient Information 2012 [place], [place].

## 2011-04-06 NOTE — ED Provider Notes (Signed)
Medical screening examination/treatment/procedure(s) were performed by non-physician practitioner and as supervising physician I was immediately available for consultation/collaboration.   Forbes Cellar, MD 04/06/11 [phone]

## 2011-05-09 ENCOUNTER — Emergency Department (HOSPITAL_COMMUNITY)
Admission: EM | Admit: 2011-05-09 | Discharge: 2011-05-10 | Disposition: A | Discharge status: Home / Self Care | Payer: Self-pay | Attending: Emergency Medicine | Admitting: Emergency Medicine

## 2011-05-09 ENCOUNTER — Encounter (HOSPITAL_COMMUNITY): Payer: Self-pay | Admitting: Emergency Medicine

## 2011-05-09 DIAGNOSIS — R112 Nausea with vomiting, unspecified: Secondary | ICD-10-CM | POA: Insufficient documentation

## 2011-05-09 DIAGNOSIS — F17200 Nicotine dependence, unspecified, uncomplicated: Secondary | ICD-10-CM | POA: Insufficient documentation

## 2011-05-09 LAB — DIFFERENTIAL
Basophils Absolute: 0 10*3/uL (ref 0.0–0.1)
Eosinophils Absolute: 0.1 10*3/uL (ref 0.0–0.7)
Eosinophils Relative: 1 % (ref 0–5)
Lymphocytes Relative: 42 % (ref 12–46)
Lymphs Abs: 2.8 10*3/uL (ref 0.7–4.0)
Monocytes Absolute: 0.5 10*3/uL (ref 0.1–1.0)

## 2011-05-09 LAB — CBC
HCT: 42.4 % (ref 39.0–52.0)
MCH: 28.1 pg (ref 26.0–34.0)
MCV: 84.5 fL (ref 78.0–100.0)
Platelets: 220 10*3/uL (ref 150–400)
RDW: 13.8 % (ref 11.5–15.5)

## 2011-05-09 LAB — LIPASE, BLOOD: Lipase: 26 U/L (ref 11–59)

## 2011-05-09 LAB — COMPREHENSIVE METABOLIC PANEL
AST: 29 U/L (ref 0–37)
Albumin: 4.7 g/dL (ref 3.5–5.2)
Alkaline Phosphatase: 62 U/L (ref 39–117)
Chloride: 101 mEq/L (ref 96–112)
Potassium: 3.4 mEq/L — ABNORMAL LOW (ref 3.5–5.1)
Sodium: 139 mEq/L (ref 135–145)
Total Bilirubin: 0.8 mg/dL (ref 0.3–1.2)
Total Protein: 8.4 g/dL — ABNORMAL HIGH (ref 6.0–8.3)

## 2011-05-09 MED ORDER — ONDANSETRON HCL 4 MG/2ML IJ SOLN
4.0000 mg | Freq: Once | INTRAMUSCULAR | Status: AC
  Administered 2011-05-09: 4 mg via INTRAVENOUS
  Filled 2011-05-09: qty 2

## 2011-05-09 MED ORDER — PANTOPRAZOLE SODIUM 40 MG IV SOLR
40.0000 mg | Freq: Once | INTRAVENOUS | Status: AC
  Administered 2011-05-09: 40 mg via INTRAVENOUS
  Filled 2011-05-09: qty 40

## 2011-05-09 MED ORDER — SODIUM CHLORIDE 0.9 % IV BOLUS (SEPSIS)
1000.0000 mL | Freq: Once | INTRAVENOUS | Status: AC
  Administered 2011-05-09: 1000 mL via INTRAVENOUS

## 2011-05-09 NOTE — ED Notes (Signed)
Pt alert, nad, c/o nausea, emesis, onset a week ago, denies recent travel, illness or exposures, resp even unlabored, skin pwd, denies changes in bowel or bladder, MMM

## 2011-05-09 NOTE — ED Provider Notes (Signed)
History     CSN: [id number]  Arrival date & time 05/09/11  1941   First MD Initiated Contact with Patient 05/09/11 2202      Chief Complaint  Patient presents with  . Nausea  . Emesis    (Consider location/radiation/quality/duration/timing/severity/associated sxs/prior treatment) Patient is a 31 y.o. male presenting with vomiting. The history is provided by the patient.  Emesis  This is a new problem. The current episode started more than 2 days ago. The problem occurs 2 to 4 times per day. The problem has not changed since onset.The emesis has an appearance of stomach contents. There has been no fever. Pertinent negatives include no abdominal pain, no chills, no cough, no diarrhea and no fever. Risk factors: He denies anyone around him with similar symptoms.     History reviewed. No pertinent past medical history.  History reviewed. No pertinent past surgical history.  No family history on file.  History  Substance Use Topics  . Smoking status: Current Everyday Smoker    Types: Cigarettes  . Smokeless tobacco: Not on file  . Alcohol Use: Yes     2-3 shots on the weekend      Review of Systems  Constitutional: Negative for fever and chills.  Respiratory: Negative.  Negative for cough and shortness of breath.   Cardiovascular: Negative.  Negative for chest pain.  Gastrointestinal: Positive for vomiting. Negative for abdominal pain and diarrhea.  Musculoskeletal: Negative.   Skin: Negative.   Neurological: Negative.     Allergies  Codeine and Shellfish allergy  Home Medications   Current Outpatient Rx  Name Route Sig Dispense Refill  . IBUPROFEN 400 MG PO TABS Oral Take 400 mg by mouth every 8 (eight) hours as needed. For pain       BP 142/72  Pulse 64  Temp(Src) 98 F (36.7 C) (Oral)  Resp 16  Wt 180 lb (81.647 kg)  SpO2 99%  Physical Exam  Constitutional: He appears well-developed and well-nourished.  HENT:  Head: Normocephalic.  Neck: Normal  range of motion. Neck supple.  Cardiovascular: Normal rate and regular rhythm.   Pulmonary/Chest: Effort normal and breath sounds normal.  Abdominal: Soft. Bowel sounds are normal. There is no tenderness. There is no rebound and no guarding.  Musculoskeletal: Normal range of motion.  Neurological: He is alert. No cranial nerve deficit.  Skin: Skin is warm and dry. No rash noted.  Psychiatric: He has a normal mood and affect.    ED Course  Procedures (including critical care time)  Labs Reviewed - No data to display No results found. Results for orders placed during the hospital encounter of 05/09/11  COMPREHENSIVE METABOLIC PANEL      Component Value Range   Sodium 139  135 - 145 (mEq/L)   Potassium 3.4 (*) 3.5 - 5.1 (mEq/L)   Chloride 101  96 - 112 (mEq/L)   CO2 24  19 - 32 (mEq/L)   Glucose, Bld 90  70 - 99 (mg/dL)   BUN 11  6 - 23 (mg/dL)   Creatinine, Ser 0.10  0.50 - 1.35 (mg/dL)   Calcium 9.6  8.4 - 27.2 (mg/dL)   Total Protein 8.4 (*) 6.0 - 8.3 (g/dL)   Albumin 4.7  3.5 - 5.2 (g/dL)   AST 29  0 - 37 (U/L)   ALT 21  0 - 53 (U/L)   Alkaline Phosphatase 62  39 - 117 (U/L)   Total Bilirubin 0.8  0.3 - 1.2 (mg/dL)  GFR calc non Af Amer >90  >90 (mL/min)   GFR calc Af Amer >90  >90 (mL/min)  CBC      Component Value Range   WBC 6.6  4.0 - 10.5 (K/uL)   RBC 5.02  4.22 - 5.81 (MIL/uL)   Hemoglobin 14.1  13.0 - 17.0 (g/dL)   HCT 16.1  09.6 - 04.5 (%)   MCV 84.5  78.0 - 100.0 (fL)   MCH 28.1  26.0 - 34.0 (pg)   MCHC 33.3  30.0 - 36.0 (g/dL)   RDW 40.9  81.1 - 91.4 (%)   Platelets 220  150 - 400 (K/uL)  DIFFERENTIAL      Component Value Range   Neutrophils Relative 50  43 - 77 (%)   Neutro Abs 3.3  1.7 - 7.7 (K/uL)   Lymphocytes Relative 42  12 - 46 (%)   Lymphs Abs 2.8  0.7 - 4.0 (K/uL)   Monocytes Relative 7  3 - 12 (%)   Monocytes Absolute 0.5  0.1 - 1.0 (K/uL)   Eosinophils Relative 1  0 - 5 (%)   Eosinophils Absolute 0.1  0.0 - 0.7 (K/uL)   Basophils Relative  0  0 - 1 (%)   Basophils Absolute 0.0  0.0 - 0.1 (K/uL)  LIPASE, BLOOD      Component Value Range   Lipase 26  11 - 59 (U/L)  URINALYSIS, ROUTINE W REFLEX MICROSCOPIC      Component Value Range   Color, Urine AMBER (*) YELLOW    APPearance CLOUDY (*) CLEAR    Specific Gravity, Urine 1.040 (*) 1.005 - 1.030    pH 6.0  5.0 - 8.0    Glucose, UA NEGATIVE  NEGATIVE (mg/dL)   Hgb urine dipstick NEGATIVE  NEGATIVE    Bilirubin Urine SMALL (*) NEGATIVE    Ketones, ur 15 (*) NEGATIVE (mg/dL)   Protein, ur 30 (*) NEGATIVE (mg/dL)   Urobilinogen, UA 1.0  0.0 - 1.0 (mg/dL)   Nitrite NEGATIVE  NEGATIVE    Leukocytes, UA NEGATIVE  NEGATIVE   URINE MICROSCOPIC-ADD ON      Component Value Range   Squamous Epithelial / LPF RARE  RARE    WBC, UA 0-2  <3 (WBC/hpf)   RBC / HPF 0-2  <3 (RBC/hpf)   Bacteria, UA MANY (*) RARE    Urine-Other MUCOUS PRESENT       No diagnosis found.  1. N, V  MDM  Tolerating PO fluids after Zofran. Reports nausea resolved. He complains of rectal pressure and reports history of prostatitis with similar symptoms, however, he declines rectal exam for evaluation of prostate. Urine culture added.        Rodena Medin, PA-C 05/10/11 [phone]

## 2011-05-10 LAB — URINALYSIS, ROUTINE W REFLEX MICROSCOPIC
Hgb urine dipstick: NEGATIVE
Ketones, ur: 15 mg/dL — AB
Nitrite: NEGATIVE
Specific Gravity, Urine: 1.04 — ABNORMAL HIGH (ref 1.005–1.030)
pH: 6 (ref 5.0–8.0)

## 2011-05-10 LAB — URINE MICROSCOPIC-ADD ON

## 2011-05-10 MED ORDER — ONDANSETRON HCL 4 MG PO TABS: 4.0000 mg | ORAL_TABLET | Freq: Four times a day (QID) | ORAL | Status: AC

## 2011-05-10 MED ORDER — PROMETHAZINE HCL 25 MG RE SUPP: 25.0000 mg | Freq: Four times a day (QID) | RECTAL | Status: DC | PRN

## 2011-05-10 NOTE — Discharge Instructions (Signed)
B.R.A.T. DIET FOR THE NEXT 6-8 HOURS THEN ADVANCE AS TOLERATED. FOLLOW UP WITH DR. Vernie Ammons FOR FURTHER EVALUATION OF RECTAL PRESSURE AND HISTORY OF PROSTATE PROBLEMS. IF VOMITING CONTINUES CALL DR. Leone Payor FOR FURTHER OUTPATIENT RECHECK. TAKE ZOFRAN AS NEEDED FOR NAUSEA AND FILL PHENERGAN PRESCRIPTION IF ZOFRAN DOES NOT CONTROL NAUSEA. RETURN HERE AS NEEDED.  B.R.A.T. Diet Your doctor has recommended the B.R.A.T. diet for you or your child until the condition improves. This is often used to help control diarrhea and vomiting symptoms. If you or your child can tolerate clear liquids, you may have:  Bananas.   Rice.   Applesauce.   Toast (and other simple starches such as crackers, potatoes, noodles).  Be sure to avoid dairy products, meats, and fatty foods until symptoms are better. Fruit juices such as apple, grape, and prune juice can make diarrhea worse. Avoid these. Continue this diet for 2 days or as instructed by your caregiver. Document Released: 01/14/2005 Document Revised: 01/03/2011 Document Reviewed: 07/03/2006 [facility] Patient Information 2012 [place], [place].

## 2011-05-10 NOTE — ED Notes (Signed)
Vital signs stable. Pt given PO fluids. Pt tolerated well at this time.

## 2011-05-11 LAB — URINE CULTURE

## 2011-05-11 NOTE — ED Provider Notes (Signed)
Medical screening examination/treatment/procedure(s) were performed by non-physician practitioner and as supervising physician I was immediately available for consultation/collaboration.   Gerhard Munch, MD 05/11/11 2237

## 2012-03-13 ENCOUNTER — Encounter (HOSPITAL_COMMUNITY): Payer: Self-pay | Admitting: Emergency Medicine

## 2012-03-13 ENCOUNTER — Emergency Department (HOSPITAL_COMMUNITY)
Admission: EM | Admit: 2012-03-13 | Discharge: 2012-03-13 | Disposition: A | Discharge status: Home / Self Care | Payer: Self-pay | Attending: Emergency Medicine | Admitting: Emergency Medicine

## 2012-03-13 DIAGNOSIS — J029 Acute pharyngitis, unspecified: Secondary | ICD-10-CM | POA: Insufficient documentation

## 2012-03-13 DIAGNOSIS — IMO0001 Reserved for inherently not codable concepts without codable children: Secondary | ICD-10-CM | POA: Insufficient documentation

## 2012-03-13 DIAGNOSIS — Z9889 Other specified postprocedural states: Secondary | ICD-10-CM | POA: Insufficient documentation

## 2012-03-13 DIAGNOSIS — R110 Nausea: Secondary | ICD-10-CM | POA: Insufficient documentation

## 2012-03-13 DIAGNOSIS — M549 Dorsalgia, unspecified: Secondary | ICD-10-CM | POA: Insufficient documentation

## 2012-03-13 DIAGNOSIS — R51 Headache: Secondary | ICD-10-CM | POA: Insufficient documentation

## 2012-03-13 DIAGNOSIS — F17200 Nicotine dependence, unspecified, uncomplicated: Secondary | ICD-10-CM | POA: Insufficient documentation

## 2012-03-13 DIAGNOSIS — R490 Dysphonia: Secondary | ICD-10-CM | POA: Insufficient documentation

## 2012-03-13 DIAGNOSIS — R05 Cough: Secondary | ICD-10-CM | POA: Insufficient documentation

## 2012-03-13 DIAGNOSIS — J3489 Other specified disorders of nose and nasal sinuses: Secondary | ICD-10-CM | POA: Insufficient documentation

## 2012-03-13 DIAGNOSIS — F1210 Cannabis abuse, uncomplicated: Secondary | ICD-10-CM | POA: Insufficient documentation

## 2012-03-13 DIAGNOSIS — R0602 Shortness of breath: Secondary | ICD-10-CM | POA: Insufficient documentation

## 2012-03-13 DIAGNOSIS — R6883 Chills (without fever): Secondary | ICD-10-CM | POA: Insufficient documentation

## 2012-03-13 DIAGNOSIS — R059 Cough, unspecified: Secondary | ICD-10-CM | POA: Insufficient documentation

## 2012-03-13 MED ORDER — IBUPROFEN 600 MG PO TABS: 600.0000 mg | ORAL_TABLET | Freq: Four times a day (QID) | ORAL | Status: DC | PRN

## 2012-03-13 MED ORDER — GUAIFENESIN 100 MG/5ML PO SYRP: 100.0000 mg | ORAL_SOLUTION | ORAL | Status: DC | PRN

## 2012-03-13 MED ORDER — KETOROLAC TROMETHAMINE 30 MG/ML IJ SOLN
30.0000 mg | Freq: Once | INTRAMUSCULAR | Status: AC
  Administered 2012-03-13: 30 mg via INTRAMUSCULAR
  Filled 2012-03-13: qty 1

## 2012-03-13 NOTE — ED Notes (Signed)
C/o non-productive cough, generalized body aches, and congestion since yesterday.  Denies fever.

## 2012-03-13 NOTE — ED Provider Notes (Signed)
History    This chart was scribed for non-physician practitioner working with Carleene Cooper III, MD by Frederik Pear, ED Scribe. This patient was seen in room TR07C/TR07C and the patient's care was started at 1932.   CSN: [id number]  Arrival date & time 03/13/12  1847   First MD Initiated Contact with Patient 03/13/12 1932      Chief Complaint  Patient presents with  . Cough  . Nasal Congestion    (Consider location/radiation/quality/duration/timing/severity/associated sxs/prior treatment) The history is provided by the patient. No language interpreter was used.    George Butler is a 32 y.o. male who presents to the Emergency Department complaining of gradual onset, constant congestion with associated myalgias, abdominal pain, back pain, a posterior headache, a hoarse, sore throat, and chills that began yesterday. He denies any rhinorrhea, ear pain, neck pain, emesis, diarrhea, dysuria, or fever. He also complains of SOB and nausea that began yesterday, but have since resolved. He denies any chronic medical conditions, including asthma or bronchitis, that require daily medications, but reports a pneumothorax that required surgery 11 years ago.He reports that he treated the symptoms with Nyquil and Alkaseltzer with no relief. He denies having a current flu shot. He is a current, everyday smoker.    History reviewed. No pertinent past medical history.  Past Surgical History  Procedure Laterality Date  . Lung surgery      No family history on file.  History  Substance Use Topics  . Smoking status: Current Every Day Smoker    Types: Cigarettes  . Smokeless tobacco: Not on file  . Alcohol Use: Yes     Comment: 2-3 shots on the weekend      Review of Systems  Constitutional: Negative for fever.  HENT: Positive for congestion and sore throat.   Respiratory: Negative for shortness of breath.   Gastrointestinal: Positive for abdominal pain. Negative for nausea, vomiting and  diarrhea.  Genitourinary: Negative for dysuria.  Musculoskeletal: Positive for myalgias and back pain.  Skin: Negative for rash.  Neurological: Positive for headaches.  All other systems reviewed and are negative.    Allergies  Codeine and Shellfish allergy  Home Medications   Current Outpatient Rx  Name  Route  Sig  Dispense  Refill  . ibuprofen (ADVIL,MOTRIN) 400 MG tablet   Oral   Take 400 mg by mouth every 8 (eight) hours as needed. For pain          . EXPIRED: promethazine (PHENERGAN) 25 MG suppository   Rectal   Place 1 suppository (25 mg total) rectally every 6 (six) hours as needed for nausea.   12 each   0     BP 130/90  Pulse 90  Temp(Src) 98.1 F (36.7 C) (Oral)  Resp 18  SpO2 98%  Physical Exam  Nursing note and vitals reviewed. Constitutional: He appears well-developed and well-nourished.  HENT:  Head: Normocephalic and atraumatic.  Right Ear: Tympanic membrane and external ear normal.  Left Ear: Tympanic membrane and external ear normal.  Nose: Nose normal.  Eyes: Pupils are equal, round, and reactive to light.  Neck: Normal range of motion. Neck supple.  Cardiovascular: Normal rate, regular rhythm and normal heart sounds.   Pulmonary/Chest: Effort normal. No respiratory distress.  Abdominal: Soft. Bowel sounds are normal. There is no tenderness.  Musculoskeletal: Normal range of motion.  Lymphadenopathy:    He has no cervical adenopathy.  Neurological: He is alert.  Psychiatric: He has a normal mood and affect.  Thought content normal.    ED Course  Procedures (including critical care time)  DIAGNOSTIC STUDIES: Oxygen Saturation is 98% on room air, normal by my interpretation.    COORDINATION OF CARE:  20:30- Discussed planned course of treatment with the patient, including Toradol, who is agreeable at this time.  20:45- Medication Orders- ketorolac (Toradol) 30 mg/mk injection 30 mg- once.   20:47- Discussed planned course of  treatment with the patient, including treating the symptoms with Motrin and Robitussin at home, who is agreeable at this time.   Labs Reviewed - No data to display No results found.   1. Influenza-like illness     Vital signs reviewed and are as follows: Filed Vitals:   03/13/12 1917  BP: 130/90  Pulse: 90  Temp: 98.1 F (36.7 C)  Resp: 18   Patient discharged to home. Encouraged to rest and drink plenty of fluids.  Patient told to return to ED or see their primary doctor if their symptoms worsen, high fever not controlled with tylenol, persistent vomiting, they feel they are dehydrated, or if they have any other concerns.  Patient verbalized understanding and agreed with plan.        MDM  Patient with influenza-like illness. Currently not febrile however patient has had chills. Symptoms are likely viral in nature. Do not suspect pneumonia or meningitis. Supportive care indicated with return if worsening.  I personally performed the services described in this documentation, which was scribed in my presence. The recorded information has been reviewed and is accurate.        Renne Crigler, [place] 03/13/12 2222

## 2012-03-13 NOTE — ED Notes (Signed)
Pt presents for evaluation of flu-like symptoms that began 2 days ago, complaining of a headache and body aches.  Pt has taken Nyquil without relief.

## 2012-03-16 NOTE — ED Provider Notes (Signed)
Medical screening examination/treatment/procedure(s) were performed by non-physician practitioner and as supervising physician I was immediately available for consultation/collaboration.   Carleene Cooper III, MD 03/16/12 [phone]

## 2012-12-07 ENCOUNTER — Encounter (HOSPITAL_COMMUNITY): Payer: Self-pay | Admitting: Emergency Medicine

## 2012-12-07 ENCOUNTER — Emergency Department (HOSPITAL_COMMUNITY)
Admission: EM | Admit: 2012-12-07 | Discharge: 2012-12-07 | Disposition: A | Discharge status: Home / Self Care | Payer: Self-pay | Attending: Emergency Medicine | Admitting: Emergency Medicine

## 2012-12-07 ENCOUNTER — Emergency Department (HOSPITAL_COMMUNITY): Payer: Self-pay

## 2012-12-07 DIAGNOSIS — M25511 Pain in right shoulder: Secondary | ICD-10-CM

## 2012-12-07 DIAGNOSIS — M25519 Pain in unspecified shoulder: Secondary | ICD-10-CM | POA: Insufficient documentation

## 2012-12-07 DIAGNOSIS — Z885 Allergy status to narcotic agent status: Secondary | ICD-10-CM | POA: Insufficient documentation

## 2012-12-07 DIAGNOSIS — F17200 Nicotine dependence, unspecified, uncomplicated: Secondary | ICD-10-CM | POA: Insufficient documentation

## 2012-12-07 MED ORDER — NAPROXEN 375 MG PO TABS: 375.0000 mg | ORAL_TABLET | Freq: Two times a day (BID) | ORAL | Status: DC

## 2012-12-07 NOTE — ED Provider Notes (Signed)
CSN: [id number]     Arrival date & time 12/07/12  1921 History  This chart was scribed for George Mutton, PA-C, working with [employer]. Rubin Payor, MD, by Ardelia Mems ED Scribe. This patient was seen in room TR11C/TR11C and the patient's care was started at 9:52 PM.   Chief Complaint  Patient presents with  . Shoulder Pain    The history is provided by the patient. No language interpreter was used.    HPI Comments: George Butler is a 32 y.o. male who presents to the Emergency Department complaining of intermittent, moderate, non-radiating right shoulder pain over the past 3 weeks, which worsened about 3 days ago. He describes this pain as "aching". He denies any recent falls, trauma or injury occuring to have onset the pain. He states that his pain is worsened with movement. He states that he has tried taking 2-3 Advil at a time, several times, as well as applying hot and cold compresses- all without relief of pain. He denies having a history of HTN, DM or cardiac diseases. He denies weakness, numbness, paresthesias, neck pain, elbow pain, left shoulder pain or any other pain or symptoms.   History reviewed. No pertinent past medical history. Past Surgical History  Procedure Laterality Date  . Lung surgery     History reviewed. No pertinent family history. History  Substance Use Topics  . Smoking status: Current Every Day Smoker    Types: Cigarettes  . Smokeless tobacco: Not on file  . Alcohol Use: Yes     Comment: 2-3 shots on the weekend    Review of Systems  Musculoskeletal: Positive for arthralgias (right shoulder).  Neurological: Negative for weakness and numbness.       Denies paresthesias.  All other systems reviewed and are negative.   Allergies  Codeine and Shellfish allergy  Home Medications   Current Outpatient Rx  Name  Route  Sig  Dispense  Refill  . ibuprofen (ADVIL,MOTRIN) 600 MG tablet   Oral   Take 1 tablet (600 mg total) by mouth every 6 (six)  hours as needed for pain.   20 tablet   0   . naproxen (NAPROSYN) 375 MG tablet   Oral   Take 1 tablet (375 mg total) by mouth 2 (two) times daily.   20 tablet   0    Triage Vitals: BP 143/93  Pulse 92  Temp(Src) 98.1 F (36.7 C) (Oral)  Resp 16  Ht 5\' 7"  (1.702 m)  Wt 194 lb 7 oz (88.196 kg)  BMI 30.45 kg/m2  SpO2 98%  Physical Exam  Nursing note and vitals reviewed. Constitutional: He is oriented to person, place, and time. He appears well-developed and well-nourished. No distress.  HENT:  Head: Normocephalic and atraumatic.  Neck: Normal range of motion. Neck supple.  Cardiovascular: Normal rate, regular rhythm and normal heart sounds.  Exam reveals no friction rub.   No murmur heard. Pulses:      Radial pulses are 2+ on the right side, and 2+ on the left side.  Pulmonary/Chest: Effort normal and breath sounds normal. No respiratory distress. He has no wheezes. He has no rales.  Musculoskeletal: He exhibits tenderness.       Arms: Negative swelling, erythema, inflammation, deformities noted to the right shoulder. Discomfort upon palpation to anterior posterior aspect of the shoulder joint. Discomfort upon the cortical clavicular region. Negative tenting of the right clavicle. Full range of motion noted-discomfort with motion of the right shoulder. Discomfort with abduction  of the right shoulder. Negative drop arm. Negative sunken in appearance.  Neurological: He is alert and oriented to person, place, and time. He exhibits normal muscle tone. Coordination normal.  Strength 5+/5+ to upper extremities bilaterally with resistance applied, equal distribution Sensation intact with differentiation to sharp and dull touch to upper extremities  Skin: Skin is warm and dry. No rash noted. He is not diaphoretic. No erythema.  Psychiatric: He has a normal mood and affect. His behavior is normal. Thought content normal.    ED Course  Procedures (including critical care  time)  DIAGNOSTIC STUDIES: Oxygen Saturation is 98% on RA, normal by my interpretation.    COORDINATION OF CARE: 10:06 PM- Discussed normal radiology findings. Pt advised of plan for treatment and pt agrees.  Labs Review Labs Reviewed - No data to display Imaging Review Dg Shoulder Right  12/07/2012   CLINICAL DATA:  Right shoulder pain without injury.  EXAM: RIGHT SHOULDER - 2+ VIEW  COMPARISON:  None.  FINDINGS: There is no evidence of fracture or dislocation. There is no evidence of arthropathy or other focal bone abnormality. Soft tissues are unremarkable.  IMPRESSION: Normal right shoulder.   Electronically Signed   By: Roque Lias M.D.   On: 12/07/2012 21:33    EKG Interpretation   None       MDM   1. Shoulder pain, right     Filed Vitals:   12/07/12 1926  BP: 143/93  Pulse: 92  Temp: 98.1 F (36.7 C)  TempSrc: Oral  Resp: 16  Height: 5\' 7"  (1.702 m)  Weight: 194 lb 7 oz (88.196 kg)  SpO2: 98%    I personally performed the services described in this documentation, which was scribed in my presence. The recorded information has been reviewed and is accurate.  Patient presenting to emergency department with right shoulder pain that has been ongoing for the past 3 weeks. Patient denies any injury or falls. Patient reports that as a throbbing, aching sensation that is constant. Patient proceeds been using Advil, icy hot.  Alert and oriented. Pain upon palpation to the anterior posterior aspect of the right shoulder. Discomfort upon palpation to the acromioclavicular joint. Negative tenting or deformities noted to the right clavicle. Negative deformities noted to the right shoulder. Decreased range of motion secondary to pain-abduction. Pulses palpable and strong. Strength intact with equal distribution. Sensation intact. Plain film of right shoulder with negative findings. Doubt septic joint. Suspicion to be possible tendinitis. Patient stable, afebrile. Discharge  patient with anti-inflammatories. Discussed with patient to continue to apply icy hot ointment massage. Discussed with patient to continue to apply hot compressions. Referred patient to orthopedics. Discussed with patient to avoid any strenuous or physical activity. Discussed with patient to continue to monitor symptoms and if symptoms are to worsen or change report back to emergency department - strict return instructions given. Patient agreed to plan of care, understood, all questions answered.  George Mutton, PA-C 12/09/12 1353

## 2012-12-07 NOTE — ED Notes (Signed)
Pt states right shoulder pain for the past three weeks. Pt states that the pain is intermittant and that it last for 3 days at a time then goes away. Pt denies known injury and has tried hot and cold with no relief pt tried OTC pain medication 2 advil with no relief.

## 2012-12-10 NOTE — ED Provider Notes (Signed)
Medical screening examination/treatment/procedure(s) were performed by non-physician practitioner and as supervising physician I was immediately available for consultation/collaboration.  EKG Interpretation   None        Ruth Tully R. Soham Hollett, MD 12/10/12 0722 

## 2013-08-05 ENCOUNTER — Emergency Department (HOSPITAL_COMMUNITY)
Admission: EM | Admit: 2013-08-05 | Discharge: 2013-08-05 | Disposition: A | Discharge status: Home / Self Care | Payer: 59 | Attending: Emergency Medicine | Admitting: Emergency Medicine

## 2013-08-05 ENCOUNTER — Encounter (HOSPITAL_COMMUNITY): Payer: Self-pay | Admitting: Emergency Medicine

## 2013-08-05 DIAGNOSIS — K047 Periapical abscess without sinus: Secondary | ICD-10-CM

## 2013-08-05 DIAGNOSIS — Z8781 Personal history of (healed) traumatic fracture: Secondary | ICD-10-CM | POA: Insufficient documentation

## 2013-08-05 DIAGNOSIS — Z792 Long term (current) use of antibiotics: Secondary | ICD-10-CM | POA: Insufficient documentation

## 2013-08-05 DIAGNOSIS — F17200 Nicotine dependence, unspecified, uncomplicated: Secondary | ICD-10-CM | POA: Insufficient documentation

## 2013-08-05 DIAGNOSIS — R51 Headache: Secondary | ICD-10-CM | POA: Insufficient documentation

## 2013-08-05 DIAGNOSIS — K029 Dental caries, unspecified: Secondary | ICD-10-CM | POA: Insufficient documentation

## 2013-08-05 MED ORDER — CLINDAMYCIN HCL 300 MG PO CAPS: 300.0000 mg | ORAL_CAPSULE | Freq: Four times a day (QID) | ORAL | Status: DC

## 2013-08-05 MED ORDER — OXYCODONE-ACETAMINOPHEN 5-325 MG PO TABS: 1.0000 | ORAL_TABLET | Freq: Four times a day (QID) | ORAL | Status: DC | PRN

## 2013-08-05 MED ORDER — OXYCODONE-ACETAMINOPHEN 5-325 MG PO TABS
1.0000 | ORAL_TABLET | Freq: Once | ORAL | Status: AC
  Administered 2013-08-05: 1 via ORAL
  Filled 2013-08-05: qty 1

## 2013-08-05 NOTE — Discharge Instructions (Signed)
Apply warm compresses to jaw throughout the day. Take antibiotic until finished. Take Percocet as directed, as needed for pain but do not drive or operate machinery with pain medication use. Followup with a dentist is very important for ongoing evaluation and management of recurrent dental pain. Return to emergency department for emergent changing or worsening symptoms.   Dental Abscess A dental abscess is a collection of infected fluid (pus) from a bacterial infection in the inner part of the tooth (pulp). It usually occurs at the end of the tooth's root.  CAUSES   Severe tooth decay.  Trauma to the tooth that allows bacteria to enter into the pulp, such as a broken or chipped tooth. SYMPTOMS   Severe pain in and around the infected tooth.  Swelling and redness around the abscessed tooth or in the mouth or face.  Tenderness.  Pus drainage.  Bad breath.  Bitter taste in the mouth.  Difficulty swallowing.  Difficulty opening the mouth.  Nausea.  Vomiting.  Chills.  Swollen neck glands. DIAGNOSIS   A medical and dental history will be taken.  An examination will be performed by tapping on the abscessed tooth.  X-rays may be taken of the tooth to identify the abscess. TREATMENT The goal of treatment is to eliminate the infection. You may be prescribed antibiotic medicine to stop the infection from spreading. A root canal may be performed to save the tooth. If the tooth cannot be saved, it may be pulled (extracted) and the abscess may be drained.  HOME CARE INSTRUCTIONS  Only take over-the-counter or prescription medicines for pain, fever, or discomfort as directed by your caregiver.  Rinse your mouth (gargle) often with salt water ( tsp salt in 8 oz [250 ml] of warm water) to relieve pain or swelling.  Do not drive after taking pain medicine (narcotics).  Do not apply heat to the outside of your face.  Return to your dentist for further treatment as directed. SEEK  MEDICAL CARE IF:  Your pain is not helped by medicine.  Your pain is getting worse instead of better. SEEK IMMEDIATE MEDICAL CARE IF:  You have a fever or persistent symptoms for more than 2-3 days.  You have a fever and your symptoms suddenly get worse.  You have chills or a very bad headache.  You have problems breathing or swallowing.  You have trouble opening your mouth.  You have swelling in the neck or around the eye. Document Released: 01/14/2005 Document Revised: 10/09/2011 Document Reviewed: 04/24/2010 [facility] Patient Information 2015 [place], [place]. This information is not intended to replace advice given to you by your health care provider. Make sure you discuss any questions you have with your health care provider.  Dental Pain Toothache is pain in or around a tooth. It may get worse with chewing or with cold or heat.  HOME CARE  Your dentist may use a numbing medicine during treatment. If so, you may need to avoid eating until the medicine wears off. Ask your dentist about this.  Only take medicine as told by your dentist or doctor.  Avoid chewing food near the painful tooth until after all treatment is done. Ask your dentist about this. GET HELP RIGHT AWAY IF:   The problem gets worse or new problems appear.  You have a fever.  There is redness and puffiness (swelling) of the face, jaw, or neck.  You cannot open your mouth.  There is pain in the jaw.  There is very bad  pain that is not helped by medicine. MAKE SURE YOU:   Understand these instructions.  Will watch your condition.  Will get help right away if you are not doing well or get worse. Document Released: 07/03/2007 Document Revised: 04/08/2011 Document Reviewed: 07/03/2007 [facility] Patient Information 2015 [place], [place]. This information is not intended to replace advice given to you by your health care provider. Make sure you discuss any questions you have with your health care  provider.  Emergency Department Resource Guide 1) Find a Doctor and Pay Out of Pocket Although you won't have to find out who is covered by your insurance plan, it is a good idea to ask around and get recommendations. You will then need to call the office and see if the doctor you have chosen will accept you as a new patient and what types of options they offer for patients who are self-pay. Some doctors offer discounts or will set up payment plans for their patients who do not have insurance, but you will need to ask so you aren't surprised when you get to your appointment.  2) Contact Your Local Health Department Not all health departments have doctors that can see patients for sick visits, but many do, so it is worth a call to see if yours does. If you don't know where your local health department is, you can check in your phone book. The CDC also has a tool to help you locate your state's health department, and many state websites also have listings of all of their local health departments.  3) Find a Walk-in Clinic If your illness is not likely to be very severe or complicated, you may want to try a walk in clinic. These are popping up all over the country in pharmacies, drugstores, and shopping centers. They're usually staffed by nurse practitioners or physician assistants that have been trained to treat common illnesses and complaints. They're usually fairly quick and inexpensive. However, if you have serious medical issues or chronic medical problems, these are probably not your best option.  No Primary Care Doctor: - Call Health Connect at  [phone] - they can help you locate a primary care doctor that  accepts your insurance, provides certain services, etc. - Physician Referral Service- [phone]  Chronic Pain Problems: Organization         Address  Phone   Notes  [facility]  [phone] Patients need to be referred by their primary care doctor.   Medication  Assistance: Organization         Address  Phone   Notes  [facility] Medication [facility] [address] [place] [place], [place] [address] [phone] --Must be a resident of [facility] -- Must have NO insurance coverage whatsoever (no Medicaid/ Medicare, etc.) -- The pt. MUST have a primary care doctor that directs their care regularly and follows them in the community   [facility]ist  [phone]   [employer]  [phone]    Agencies that provide inexpensive medical care: Organization         Address  Phone   Notes  [facility]  [phone]   [facility]    [phone]   [facility] [address] [place], [place] [address] [phone]   [facility] [address] [place] [place] [phone]   Planned Parenthood    [phone]   [facility]    (  [phone]   [facility] and [facility]  [address], [place] Phone:  [phone](336) [phone], Fax:  [phone] Hours of Operation:  9 am - 6 pm, M-F.  Also accepts Medicaid/Medicare and self-pay.  [facility]  [address], [place] Phone: [phone], Fax: [phone](336) [phone]. Hours of Operation:  8:30 am - 5:30 pm, M-F.  Also accepts Medicaid and self-pay.  [facility] [place] [address], [place] Phone: [phone](336) [phone]   [facility] [address], [place], [place] [phone], Ext. [phone] Mondays & Thursdays: 7-9 AM.  First 15 patients are seen on a first come, first serve basis.    Medicaid-accepting [facility] Providers:  Organization         Address  Phone   Notes  [facility] [address], [place] [phone]) [phone] Also accepts self-pay patients.  [facility] [address], [place]  [phone](336) [phone]   [facility] [address], [place]  [phone](336) [phone]   [facility] [address] [place] [address], [place] [phone]) [phone]   Renaye RakersVeita Bland [address], [place]   [phone] Only accepts [place] Access [place] patients after they have their name applied to their card.   Self-Pay (no insurance) in [facility] [place]:  Organization         Address  Phone   Notes  Sickle Cell Patients, [facility] [address], [place] [phone]) [phone]   [facility] [address], [place] [phone]) [phone]   [facility] [place]  [address], [place] [phone]) [phone]   [facility]/Dr. Osei-Bonsu  [address] [place] [address], [place] or [phone], [place] [phone]) [phone] Phone number for both [place] and [place] locations is the same.  [facility] and [facility] [address], [place] [phone](336) [phone]   [facility] [place] [address] [place] [address], [place] or [address] [phone](336) [phone](336) [phone]   [facility] [address], [place] [phone](336) [phone], phone; [phone](336) [phone], fax Sees patients 1st and 3rd Saturday of every month.  Must not qualify for public or private insurance (i.e. Medicaid, Medicare, [place] Health Choice, Veterans' Benefits)  Household income should be no more than 200% of the poverty level The clinic cannot treat you if you are pregnant or think you are pregnant  Sexually transmitted diseases are not treated at the clinic.    Dental Care: Organization         Address  Phone  Notes  [facility] Department of [facility] [address], [place] [phone]) [phone] Accepts children up to age 31 who are enrolled in [place]icaid or [place] Health Choice; pregnant women with a Medicaid card; and children who have applied for Medicaid or [place] Health Choice, but were declined, whose parents can pay a reduced fee at time of service.  [facility]  [facility] of [facility] [place]  [address], [place] [phone] Accepts children up to age 31 who are enrolled in [place] or [place] Health Choice; pregnant women with a Medicaid card; and children who have applied for Medicaid or [place] Health Choice, but were declined, whose parents can pay a reduced fee at time of service.  [facility]  [address] [place], [place] [phone] Patients are seen by appointment only. Walk-ins are not accepted. [facility] will see patients 15 years of age and older. Monday - Tuesday (8am-5pm) Most Wednesdays (8:30-5pm) $30 per visit, cash only  [facility] Access PRO[facility]  [address], [facility] [phone] Patients are seen by appointment only. Walk-ins are not accepted. [facility] will see patients 64 years of age and older. One Wednesday Evening (Monthly: Volunteer Based).  $30 per visit, cash only  [employer]  [phone] for adults; Children under age 75, call Graduate Pediatric Dentistry at [phone]. Children aged 86-14, please call [phone] to request a pediatric application.  Dental services are provided in all areas of dental care including fillings, crowns and bridges, complete and partial dentures, implants, gum treatment, root canals, and extractions. Preventive care is also provided. Treatment is provided to both adults and children. Patients are selected via a lottery and there is often a waiting list.   [facility] [address] [place]  [phone] [email or web address]   [facility] [address] [place], [place] [phone], Ext. [phone] Second and Fourth Thursday of each month, opens at 6:30 AM; Clinic ends at 9 AM.  Patients are seen on a first-come first-served basis, and a limited number are seen during each clinic.   [facility]  [address] Ether [place]ins [place], [place] [phone]    Eligibility Requirements You must have lived in [place], [place], or [place] counties for at least the last three months.   You cannot be eligible for state or federal sponsored [place], including [employer], [place], or [employer].   You generally cannot be eligible for healthcare insurance through your employer.    How to apply: Eligibility screenings are held every Tuesday and Wednesday afternoon from 1:00 pm until 4:00 pm. You do not need an appointment for the interview!  [facility] [address], [place], [place] [phone]   [facility] Health Department  [phone]   [facility] Health Department  [phone]   [facility] Health Department  [phone]    Behavioral Health Resources in the Community: Intensive Outpatient Programs Organization         Address  Phone  Notes  [facility] [address] [place], [place] [phone]   [facility] [address], [place], [place] [phone]   [facility] [address], [place], [place]  [phone]   [facility] Mental Health [address],  [place], [place] [phone] or [phone]   Substance Abuse Resources Organization         Address  Phone  Notes  [facility] Drug Services  [phone]   [facility]  [phone]   [facility]  [phone]   Floydene Flock  [phone]   Residential & Outpatient Substance Abuse Program  [phone]   Psychological Services Organization         Address  Phone  Notes  [facility] Behavioral Health  [phone]   [facility]  [phone]   [facility] Mental Health [address], [place] [phone] or [phone]    Mobile Crisis Teams Organization         Address  Phone  Notes  [facility], Mobile Crisis Care Unit  [phone]   [facility]  [address]  [place],  [place] [phone]   [facility] [address] [place] [phone]    Self-Help/Support Groups Organization         Address  Phone             Notes  Mental Health Assoc. of [place] - variety of support groups  336- [phone] Call for more information  Narcotics Anonymous (NA), [facility] [address], [place]  2 meetings at this location   Statisticianesidential Treatment Programs Organization         Address  Phone  Notes  [facility] [address],    [place]  [phone]   [facility]  [address], [place] [phone], [place], [place] [phone]   [facility] [address], [place] [phone] Admissions: 8am-3pm M-F  [facility] [address]    [place], [place] [phone]   [facility] [address], [place], [place] [phone]   [facility] [address]  [place], [place] [phone]   [facility] - Intensive Outpatient [address], [place], [place] [phone]   [facility] [place] [facility] ([facility] [address]  [place], [place] [phone] or [phone]   [facility] ([facility]) [address] [place], [place] [phone] Accepts Medicaid  [facility] [address]  [place] [phone] Substance Abuse/Addiction Treatment   [facility] Behavioral Health Resources Organization         Address  Phone  Notes  [facility]  [phone]) [phone]   Angie FavaJulie Brannon, PhD [address], [address] [place], [place]   [phone]) [phone] or [phone](336) [phone]   [facility]   [address] [place], [place] [phone](336) [phone]   [facility] [address], [place], [place] [phone](336) [phone] Insurance/Medicaid/sponsorship through [facility]  Faith and Families [address]                                    [place], [place] [phone](336) [phone] Therapy/tele-psych/case    [facility] [address].   [place], [place] [phone](336) [phone]    Dr. Lolly MustacheArfeen  [phone](336) [phone]   [facility]  [employer] [facility] 1) [address],  2) [address], [place] 3)  [address], [place] [phone]) [phone]) [phone]  [phone](336) [phone]   [facility] Child Abuse Hotline [phone](336) [phone] or [phone](336) [phone] (After Hours)

## 2013-08-05 NOTE — ED Provider Notes (Signed)
CSN: [id number]     Arrival date & time 08/05/13  1344 History  This chart was scribed for [employer], PA-C  working with No att. providers found by Ashley Jacobs, ED scribe. This patient was seen in room TR07C/TR07C and the patient's care was started at 3:09 PM.   None    Chief Complaint  Patient presents with  . Dental Pain     (Consider location/radiation/quality/duration/timing/severity/associated sxs/prior Treatment) Patient is a 33 y.o. male presenting with dental injury. The history is provided by the patient and medical records. No language interpreter was used.  Dental Injury This is a new problem. The current episode started more than 2 days ago. The problem occurs constantly. The problem has not changed since onset.Associated symptoms include headaches. Pertinent negatives include no chest pain, no abdominal pain and no shortness of breath. Exacerbated by: air flow. Nothing relieves the symptoms. He has tried a cold compress and acetaminophen for the symptoms. The treatment provided mild relief.   HPI Comments: George Butler is a 33 y.o. male who presents to the Emergency Department complaining of left lower, severe, dental pain with swelling for the past two days, which radiates along the bottom L aspect of his mouth. The pain is described as throbbing, sharp and worse with contact with air.  He fractured his tooth while in prison one year ago while eating.  Pt has tried applying a warm compress but that does not seem to help. Has tried Tylenol with no relief. He has an associated headache. Denies facial swelling, blurry vision, ear pain or drainage, rhinorrhea, eye pain or redness, fevers/chills, abd pain, nausea and vomiting, CP, SOB, paresthesias, myalgias or arthralgias. Currently smokes.    History reviewed. No pertinent past medical history. Past Surgical History  Procedure Laterality Date  . Lung surgery     No family history on file. History   Substance Use Topics  . Smoking status: Current Every Day Smoker -- 0.50 packs/day    Types: Cigarettes  . Smokeless tobacco: Not on file  . Alcohol Use: No     Comment: 2-3 shots on the weekend    Review of Systems  Constitutional: Negative for fever and chills.  HENT: Positive for dental problem and facial swelling. Negative for congestion, drooling, ear discharge, ear pain, hearing loss, mouth sores, postnasal drip, rhinorrhea, sinus pressure, sore throat, tinnitus, trouble swallowing and voice change.   Eyes: Negative for pain, discharge, redness, itching and visual disturbance.  Respiratory: Negative for chest tightness, shortness of breath, wheezing and stridor.   Cardiovascular: Negative for chest pain.  Gastrointestinal: Negative for nausea, vomiting, abdominal pain and diarrhea.  Musculoskeletal: Negative for arthralgias, myalgias, neck pain and neck stiffness.  Skin: Negative for color change and rash.  Neurological: Positive for headaches. Negative for dizziness, facial asymmetry, weakness and numbness.       Tingling to left jaw  All other systems reviewed and are negative.     Allergies  Codeine and Shellfish allergy  Home Medications   Prior to Admission medications   Medication Sig Start Date End Date Taking? Authorizing Provider  acetaminophen (TYLENOL) 325 MG tablet Take 650 mg by mouth every 6 (six) hours as needed (dental pain).   Yes Historical Provider, MD  ibuprofen (ADVIL,MOTRIN) 600 MG tablet Take 1 tablet (600 mg total) by mouth every 6 (six) hours as needed for pain. 03/13/12  Yes Renne Crigler, PA-C  clindamycin (CLEOCIN) 300 MG capsule Take 1 capsule (300 mg total) by mouth  4 (four) times daily. X 7 days 08/05/13   [place] Camprubi-Soms, PA-C  oxyCODONE-acetaminophen (PERCOCET) 5-325 MG per tablet Take 1-2 tablets by mouth every 6 (six) hours as needed for severe pain. 08/05/13   Merrill Villarruel Strupp Camprubi-Soms, PA-C   BP 128/76  Pulse 87   Temp(Src) 98.6 F (37 C) (Oral)  Resp 12  Ht 5\' 7"  (1.702 m)  Wt 197 lb (89.359 kg)  BMI 30.85 kg/m2  SpO2 100% Physical Exam  Nursing note and vitals reviewed. Constitutional: He is oriented to person, place, and time. Vital signs are normal. He appears well-developed and well-nourished. No distress.  Pt appears very uncomfortable, rocking back and forth in chair, but in NAD, VSS  HENT:  Head: Normocephalic and atraumatic.  Nose: Nose normal.  Mouth/Throat: Uvula is midline, oropharynx is clear and moist and mucous membranes are normal. No trismus in the jaw. Abnormal dentition. Dental abscesses and dental caries present. No uvula swelling.  Generalized poor dentitia with caries. Left lower molar #17 broken and abscessed, draining purulent material, TTP, difficulty opening mouth wide but no trismus, no peritonsillar swelling or abscess uvula midline with no swelling. Posterior oropharynx clear of erythema, edema, or exudate. MMM  Eyes: Conjunctivae, EOM and lids are normal.  Neck: Normal range of motion and phonation normal. Neck supple. No rigidity. Normal range of motion present.  C-spine non-TTP along spinous processes and paraspinous muscles, FROM intact, no rigidity or meningeal signs. No stridor  Cardiovascular: Normal rate.   Pulmonary/Chest: Effort normal. No stridor. No respiratory distress.  Abdominal: Normal appearance. He exhibits no distension.  Musculoskeletal: Normal range of motion.  Lymphadenopathy:       Head (right side): No submental, no submandibular and no tonsillar adenopathy present.       Head (left side): Submandibular adenopathy present. No submental and no tonsillar adenopathy present.    He has no cervical adenopathy.  Submandibular LAD on left side, TTP, no erythema of jawline. Mildly swollen, angle of jaw not obscured.  Neurological: He is alert and oriented to person, place, and time.  Sensation along CNV intact. Motor function of CNVII intact. Sensation  grossly intact  Skin: Skin is warm, dry and intact. He is not diaphoretic. No erythema.  No erythema or rashes noted  Psychiatric: He has a normal mood and affect.  Appears uncomfortable and in pain    ED Course  Dental Block- Inferior alveolar Date/Time: 08/05/2013 3:00 PM Performed by: CAMPRUBI-SOMS, Amarii Bordas STRUPP Authorized by: Ramond Marrow Consent: Verbal consent obtained. Risks and benefits: risks, benefits and alternatives were discussed Consent given by: patient Patient understanding: patient states understanding of the procedure being performed Patient consent: the patient's understanding of the procedure matches consent given Patient identity confirmed: verbally with patient Local anesthesia used: yes Local anesthetic: bupivacaine 0.5% with epinephrine and topical anesthetic Anesthetic total: 1.8 ml Patient sedated: no Patient tolerance: Patient tolerated the procedure well with no immediate complications. Comments: Prepped with topical lidocaine gel on cotton tipped applicator, injected 1 full ampule from dental box. Immediate relief within 1 minute, pt tolerated well.    (including critical care time) DIAGNOSTIC STUDIES: Oxygen Saturation is 100% on room air, normal by my interpretation.    COORDINATION OF CARE:  3:14 PM Discussed course of care with pt which includes oxycodone.  Pt understands and agrees. 3:16 PM Performed a dental block. I will give pt pain medication and antibiotics. Advised pt to follow up with a dentist. Recommended pt to return if has  a fever unresolved with Tylenol or he has trouble breathing. Pt mentions that the pain is better. He requests a work note. He is ready for discharge.   Labs Review Labs Reviewed - No data to display  Imaging Review No results found.   EKG Interpretation None      MDM   Final diagnoses:  Dental abscess    George Butler is a 33 y.o. male presenting with dental pain, appears very  uncomfortable. Large draining abscess around L lower molar #17. Once roomed, pt given Percocet to help with pain relief. Dental block performed with immediate relief, although pt did state that the injection site was sore, but his tooth no longer hurt. Pt afebrile, non-toxic, handling secretions well, therefore I do not feel imaging is necessary at this time. Discussed heat compresses. Given percocet and Clinda rx. I gave patient referral to dentist and stressed the importance of dental follow up for ultimate management of dental pain.  I have also discussed reasons to return immediately to the ER.  Patient expresses understanding and agrees with plan.  I personally performed the services described in this documentation, which was scribed in my presence. The recorded information has been reviewed and is accurate.  BP 128/76  Pulse 87  Temp(Src) 98.6 F (37 C) (Oral)  Resp 12  Ht 5\' 7"  (1.702 m)  Wt 197 lb (89.359 kg)  BMI 30.85 kg/m2  SpO2 100%   [employer] Strupp Camprubi-Soms, PA-C 08/05/13 2003

## 2013-08-05 NOTE — ED Notes (Signed)
Patient states broke tooth in prison about a year ago when he was eating.   Broken tooth is on left lower molar.

## 2013-08-07 NOTE — ED Provider Notes (Signed)
Medical screening examination/treatment/procedure(s) were performed by non-physician practitioner and as supervising physician I was immediately available for consultation/collaboration.   EKG Interpretation None        Erika Slaby M Yarel Rushlow, DO 08/07/13 1425 

## 2013-11-22 ENCOUNTER — Ambulatory Visit: Payer: Self-pay | Admitting: Family Medicine

## 2014-10-06 ENCOUNTER — Encounter (HOSPITAL_COMMUNITY): Payer: Self-pay | Admitting: Emergency Medicine

## 2014-10-06 ENCOUNTER — Emergency Department (HOSPITAL_COMMUNITY)
Admission: EM | Admit: 2014-10-06 | Discharge: 2014-10-06 | Disposition: A | Discharge status: Home / Self Care | Payer: Self-pay | Attending: Emergency Medicine | Admitting: Emergency Medicine

## 2014-10-06 DIAGNOSIS — X58XXXA Exposure to other specified factors, initial encounter: Secondary | ICD-10-CM | POA: Insufficient documentation

## 2014-10-06 DIAGNOSIS — Y939 Activity, unspecified: Secondary | ICD-10-CM | POA: Insufficient documentation

## 2014-10-06 DIAGNOSIS — Y999 Unspecified external cause status: Secondary | ICD-10-CM | POA: Insufficient documentation

## 2014-10-06 DIAGNOSIS — Z720 Tobacco use: Secondary | ICD-10-CM | POA: Insufficient documentation

## 2014-10-06 DIAGNOSIS — M542 Cervicalgia: Secondary | ICD-10-CM

## 2014-10-06 DIAGNOSIS — Y929 Unspecified place or not applicable: Secondary | ICD-10-CM | POA: Insufficient documentation

## 2014-10-06 DIAGNOSIS — S199XXA Unspecified injury of neck, initial encounter: Secondary | ICD-10-CM | POA: Insufficient documentation

## 2014-10-06 MED ORDER — DIAZEPAM 5 MG/ML IJ SOLN: 5.0000 mg | Freq: Once | INTRAMUSCULAR | Status: DC

## 2014-10-06 MED ORDER — ONDANSETRON 4 MG PO TBDP
4.0000 mg | ORAL_TABLET | Freq: Once | ORAL | Status: AC
  Administered 2014-10-06: 4 mg via ORAL
  Filled 2014-10-06: qty 1

## 2014-10-06 MED ORDER — METHOCARBAMOL 500 MG PO TABS: 500.0000 mg | ORAL_TABLET | Freq: Two times a day (BID) | ORAL | Status: DC

## 2014-10-06 MED ORDER — KETOROLAC TROMETHAMINE 60 MG/2ML IM SOLN
60.0000 mg | Freq: Once | INTRAMUSCULAR | Status: AC
  Administered 2014-10-06: 60 mg via INTRAMUSCULAR
  Filled 2014-10-06: qty 2

## 2014-10-06 MED ORDER — DIAZEPAM 5 MG PO TABS
5.0000 mg | ORAL_TABLET | Freq: Once | ORAL | Status: AC
  Administered 2014-10-06: 5 mg via ORAL
  Filled 2014-10-06: qty 1

## 2014-10-06 MED ORDER — HYDROCODONE-ACETAMINOPHEN 5-325 MG PO TABS: 2.0000 | ORAL_TABLET | ORAL | Status: DC | PRN

## 2014-10-06 MED ORDER — NAPROXEN 500 MG PO TABS: 500.0000 mg | ORAL_TABLET | Freq: Two times a day (BID) | ORAL | Status: DC

## 2014-10-06 MED ORDER — KETOROLAC TROMETHAMINE 30 MG/ML IJ SOLN: 30.0000 mg | Freq: Once | INTRAMUSCULAR | Status: DC

## 2014-10-06 NOTE — ED Notes (Signed)
Pt states yesterday he felt a sharp pain in neck, pt states later he felt a bump in the same area. Pt states this morning he began vomiting.

## 2014-10-06 NOTE — Discharge Instructions (Signed)
Cervical Sprain Follow up with a provider using the resource guide below. Take naproxen for pain and Vicodin for breakthrough pain.  A cervical sprain is an injury in the neck in which the strong, fibrous tissues (ligaments) that connect your neck bones stretch or tear. Cervical sprains can range from mild to severe. Severe cervical sprains can cause the neck vertebrae to be unstable. This can lead to damage of the spinal cord and can result in serious nervous system problems. The amount of time it takes for a cervical sprain to get better depends on the cause and extent of the injury. Most cervical sprains heal in 1 to 3 weeks. CAUSES  Severe cervical sprains may be caused by:   Contact sport injuries (such as from football, rugby, wrestling, hockey, auto racing, gymnastics, diving, martial arts, or boxing).   Motor vehicle collisions.   Whiplash injuries. This is an injury from a sudden forward and backward whipping movement of the head and neck.  Falls.  Mild cervical sprains may be caused by:   Being in an awkward position, such as while cradling a telephone between your ear and shoulder.   Sitting in a chair that does not offer proper support.   Working at a poorly Marketing executive station.   Looking up or down for long periods of time.  SYMPTOMS   Pain, soreness, stiffness, or a burning sensation in the front, back, or sides of the neck. This discomfort may develop immediately after the injury or slowly, 24 hours or more after the injury.   Pain or tenderness directly in the middle of the back of the neck.   Shoulder or upper back pain.   Limited ability to move the neck.   Headache.   Dizziness.   Weakness, numbness, or tingling in the hands or arms.   Muscle spasms.   Difficulty swallowing or chewing.   Tenderness and swelling of the neck.  DIAGNOSIS  Most of the time your health care provider can diagnose a cervical sprain by taking your history  and doing a physical exam. Your health care provider will ask about previous neck injuries and any known neck problems, such as arthritis in the neck. X-rays may be taken to find out if there are any other problems, such as with the bones of the neck. Other tests, such as a CT scan or MRI, may also be needed.  TREATMENT  Treatment depends on the severity of the cervical sprain. Mild sprains can be treated with rest, keeping the neck in place (immobilization), and pain medicines. Severe cervical sprains are immediately immobilized. Further treatment is done to help with pain, muscle spasms, and other symptoms and may include:  Medicines, such as pain relievers, numbing medicines, or muscle relaxants.   Physical therapy. This may involve stretching exercises, strengthening exercises, and posture training. Exercises and improved posture can help stabilize the neck, strengthen muscles, and help stop symptoms from returning.  HOME CARE INSTRUCTIONS   Put ice on the injured area.   Put ice in a plastic bag.   Place a towel between your skin and the bag.   Leave the ice on for 15-20 minutes, 3-4 times a day.   If your injury was severe, you may have been given a cervical collar to wear. A cervical collar is a two-piece collar designed to keep your neck from moving while it heals.  Do not remove the collar unless instructed by your health care provider.  If you have long hair,  keep it outside of the collar.  Ask your health care provider before making any adjustments to your collar. Minor adjustments may be required over time to improve comfort and reduce pressure on your chin or on the back of your head.  Ifyou are allowed to remove the collar for cleaning or bathing, follow your health care provider's instructions on how to do so safely.  Keep your collar clean by wiping it with mild soap and water and drying it completely. If the collar you have been given includes removable pads, remove  them every 1-2 days and hand wash them with soap and water. Allow them to air dry. They should be completely dry before you wear them in the collar.  If you are allowed to remove the collar for cleaning and bathing, wash and dry the skin of your neck. Check your skin for irritation or sores. If you see any, tell your health care provider.  Do not drive while wearing the collar.   Only take over-the-counter or prescription medicines for pain, discomfort, or fever as directed by your health care provider.   Keep all follow-up appointments as directed by your health care provider.   Keep all physical therapy appointments as directed by your health care provider.   Make any needed adjustments to your workstation to promote good posture.   Avoid positions and activities that make your symptoms worse.   Warm up and stretch before being active to help prevent problems.  SEEK MEDICAL CARE IF:   Your pain is not controlled with medicine.   You are unable to decrease your pain medicine over time as planned.   Your activity level is not improving as expected.  SEEK IMMEDIATE MEDICAL CARE IF:   You develop any bleeding.  You develop stomach upset.  You have signs of an allergic reaction to your medicine.   Your symptoms get worse.   You develop new, unexplained symptoms.   You have numbness, tingling, weakness, or paralysis in any part of your body.  MAKE SURE YOU:   Understand these instructions.  Will watch your condition.  Will get help right away if you are not doing well or get worse. Document Released: 11/11/2006 Document Revised: 01/19/2013 Document Reviewed: 07/22/2012 [facility] Patient Information 2015 [place], [place]. This information is not intended to replace advice given to you by your health care provider. Make sure you discuss any questions you have with your health care provider.  Emergency Department Resource Guide 1) Find a Doctor and Pay Out of  Pocket Although you won't have to find out who is covered by your insurance plan, it is a good idea to ask around and get recommendations. You will then need to call the office and see if the doctor you have chosen will accept you as a new patient and what types of options they offer for patients who are self-pay. Some doctors offer discounts or will set up payment plans for their patients who do not have insurance, but you will need to ask so you aren't surprised when you get to your appointment.  2) Contact Your Local Health Department Not all health departments have doctors that can see patients for sick visits, but many do, so it is worth a call to see if yours does. If you don't know where your local health department is, you can check in your phone book. The CDC also has a tool to help you locate your state's health department, and many state websites also have  listings of all of their local health departments.  3) Find a Walk-in Clinic If your illness is not likely to be very severe or complicated, you may want to try a walk in clinic. These are popping up all over the country in pharmacies, drugstores, and shopping centers. They're usually staffed by nurse practitioners or physician assistants that have been trained to treat common illnesses and complaints. They're usually fairly quick and inexpensive. However, if you have serious medical issues or chronic medical problems, these are probably not your best option.  No Primary Care Doctor: - Call Health Connect at  [phone] - they can help you locate a primary care doctor that  accepts your insurance, provides certain services, etc. - Physician Referral Service- [phone]  Chronic Pain Problems: Organization         Address  Phone   Notes  [facility]  [phone] Patients need to be referred by their primary care doctor.   Medication Assistance: Organization         Address  Phone   Notes  [facility]  Medication [facility] [address] [place], [place] [address] [phone] --Must be a resident of [facility] -- Must have NO insurance coverage whatsoever (no Medicaid/ Medicare, etc.) -- The pt. MUST have a primary care doctor that directs their care regularly and follows them in the community   [facility]ist  [phone]   [employer]  [phone]    Agencies that provide inexpensive medical care: Organization         Address  Phone   Notes  [facility]  [phone]   [facility]    [phone]   [facility] [address] [place], [place] [address] [phone]   [facility] [address] [place] [phone]   Planned Parenthood    [phone]   [facility]    [phone]   Community Health and [facility]  [address], [place] Phone:  [phone], Fax:  [phone] Hours of Operation:  9 am - 6 pm, M-F.  Also accepts Medicaid/Medicare and self-pay.  [facility]  [address], [place] Phone: [phone], Fax: [phone]. Hours of Operation:  8:30 am - 5:30 pm, M-F.  Also accepts Medicaid and self-pay.  [facility] [place] [address] [place] Phone: [phone]   [facility] [address] Natasha Bence [place], [place] [phone], Ext. [phone] Mondays & Thursdays: 7-9 AM.  First 15 patients are seen on a first come, first serve basis.    Medicaid-accepting [facility] Providers:  Organization         Address  Phone   Notes  [facility] [address], [place] [phone] Also accepts self-pay patients.  [facility] [address] Laurell Josephs [place], [place]  [phone]   [facility] [address], [place] [phone]   [facility] [address], [place] [phone]   Renaye Rakers [address], [place]   [phone] Only accepts [place] patients after they have their name applied to their card.   Self-Pay (no insurance) in [facility]:  [employer]  Notes  Sickle Cell Patients, [facility] [address], [place] [phone]   [facility] [address], [place] [phone]   [facility]  [address], [place] [phone]) [phone]   [facility]/Dr. Osei-Bonsu  [address] [place] [address], [place] or [phone], [place] [phone] Phone number for both [place] and [place] locations is the same.  [facility]gent [facility] and [facility] [address], [place] [phone]   [facility] [address] [place] [address], [place] or [address] [phone]   [facility] [address], [place] [phone], phone; [phone], fax Sees patients 1st and 3rd Saturday of every month.  Must not qualify for public or private insurance (i.e. Medicaid, Medicare, [place] Health Choice, Veterans' Benefits)  Household income should be no more than 200% of the poverty level The clinic cannot treat you if you are pregnant or think you are pregnant  Sexually transmitted diseases are not treated at the clinic.    Dental Care: Organization         Address  Phone  Notes  [facility] [address], [place] [phone] Accepts children up to age 821 who are enrolled in [place]icaid or [place] Health Choice; pregnant women with a Medicaid card; and children who have applied for Medicaid or [place] Health Choice, but were declined, whose parents can pay a reduced fee at time of service.  [facility] [place]  [address], [place]  [phone] Accepts children up to age 32 who are enrolled in [place]icaid or [place] Health Choice; pregnant women with a Medicaid card; and children who have applied for Medicaid or [place] Health Choice, but were declined, whose parents can pay a reduced fee at time of service.  [facility]  [address], [place] [phone]) [phone] Patients are seen by appointment only. Walk-ins are not accepted. [facility] will see patients 218 years of age and older. Monday - Tuesday (8am-5pm) Most Wednesdays (8:30-5pm) $30 per visit, cash only  [facility]  [address], [facility] [place] [phone]) [phone] Patients are seen by appointment only. Walk-ins are not accepted. [facility] will see patients 32 years of age and older. One Wednesday Evening (Monthly: Volunteer Based).  $30 per visit, cash only  [employer] [facility] [facility]  [phone] for adults; Children under age 444, call Graduate Pediatric Dentistry at [phone]) [phone]. Children aged 654-14, please call [phone] to request a pediatric application.  Dental services are provided in all areas of dental care including fillings, crowns and bridges, complete and partial dentures, implants, gum treatment, root canals, and extractions. Preventive care is also provided. Treatment is provided to both adults and children. Patients are selected via a lottery and there is often a waiting list.   [facility] [address], [place]  [phone] [email or web address]   [facility] [address], [place], [place] [phone], Ext. [phone] Second and Fourth Thursday of each month, opens at 6:30 AM; Clinic ends at 9 AM.  Patients are seen on a first-come first-served basis, and a limited number are seen during each clinic.   [facility]  [address], [place], [place] [phone]  Eligibility Requirements You must have lived in [place], [place], or [place]  counties for at least the last three months.   You cannot be eligible for state or federal sponsored National Cityhealthcare insurance, including CIGNAVeterans Administration, [place]icaid, or [employer] EntertainmentMedicare.   You generally cannot be eligible for healthcare insurance through your employer.    How to apply: Eligibility screenings are held every Tuesday and Wednesday afternoon from 1:00 pm until 4:00 pm. You do not need an appointment for the interview!  [facility] [address], [place], [place] [phone]   [facility]  [phone]   [facility]  [phone]   [facility]  [phone]    Behavioral Health Resources in the Community: Intensive Outpatient Programs Organization         Address  Phone  Notes  [facility] [address], [place], [place] [phone]   [facility] [address], [place], [place] [phone]   [facility] [address], [place], [place]  [phone]   [facility] [address],  [place], [place] [phone] or [phone]   Substance Abuse Resources Organization         Address  Phone  Notes  [facility]  [phone]   [facility]  [phone]   [facility]  [phone]   [facility]  [phone]   Residential & Outpatient Substance Abuse Program  [phone]   Psychological Services Organization         Address  Phone  Notes  [facility]  [phone]- [phone]   [facility]  [phone]   [facility] [address], [place] [phone] or [phone]    Mobile Crisis Teams Organization         Address  Phone  Notes  [facility], Mobile Crisis Care Unit  [phone]   [facility]  [address] [place], [place] [phone]   Doristine LocksSharon DeEsch [address] [place]  [place] [phone]    Self-Help/Support Groups Organization         Address  Phone             Notes  [facility] [place] - variety of support groups  336- [phone] Call for more information  Narcotics Anonymous (NA), [facility] [address], [place]  2 meetings at this location   Statisticianesidential Treatment Programs Organization         Address  Phone  Notes  [facility] [address],    [place]  [phone]   [facility]  [address], [place] [phone], [place], [place] [phone]   [facility] [address], [place] [phone] Admissions: 8am-3pm M-F  [facility] [address]    [place], [place] [phone]   [facility] [address], [place], [place] [phone]   [facility] [address]  [place], [place] [phone]   [facility] - Intensive Outpatient [address], [place], [place] [phone]   [facility] ([facility] [address]  [place], [place] [phone] or [phone]   [facility] ([facility]) [address] [place], [place] [phone] Accepts Medicaid  [facility] [address]  [place] [phone] Substance Abuse/Addiction Treatment   [facility] Resources Organization  Address  Phone  Notes  °[facility]  [phone]) [phone]   °Julie Brannon, PhD [address] [place], [place]   ([phone]) [phone] or (336) [phone]   °[facility]   [address] °[place], [place] ([phone]) [phone]   °[facility] [address], [place], [place] ([phone]) [phone] Insurance/Medicaid/sponsorship through [facility]  °[facility] [address]                                    [place], [place] [phone] Therapy/tele-psych/case  °[facility] [address]  ° [place], [place] [phone]) [phone]    °Dr. Arfeen  ([phone])  [phone]   °[facility] [place]  [employer] [place] [facility] 1) [address], [place] °2) [address], [place] °3)  [address], [place] ([phone]) [phone] °([phone]) [phone] ° °([phone]) [phone]   °[place] Child Abuse Hotline (336) [phone] or (336) [phone] (After Hours)    ° ° ° °

## 2014-10-06 NOTE — ED Provider Notes (Signed)
CSN: [id number]     Arrival date & time 10/06/14  1148 History   First MD Initiated Contact with Patient 10/06/14 1207     Chief Complaint  Patient presents with  . Emesis     (Consider location/radiation/quality/duration/timing/severity/associated sxs/prior Treatment) Patient is a 34 y.o. male presenting with vomiting. The history is provided by the patient. No language interpreter was used.  Emesis Associated symptoms: no chills and no headaches   George Butler is a 34 y.o male with a history of lung surgery who presents for right sided neck pain that began yesterday after taking a deep breath and 1 episode of vomiting this morning due to pain. He says that he felt a pop in his neck and since then there is a knot on the back of his neck. He took ibuprofen with some relief.  He denies any fever, chills, headache, shortness of breath. No recent fall or injury.   History reviewed. No pertinent past medical history. Past Surgical History  Procedure Laterality Date  . Lung surgery     History reviewed. No pertinent family history. Social History  Substance Use Topics  . Smoking status: Current Every Day Smoker -- 0.50 packs/day    Types: Cigarettes  . Smokeless tobacco: None  . Alcohol Use: No     Comment: 2-3 shots on the weekend    Review of Systems  Constitutional: Negative for fever and chills.  Gastrointestinal: Positive for nausea and vomiting.  Musculoskeletal: Positive for neck pain. Negative for back pain.  Neurological: Negative for weakness and headaches.  All other systems reviewed and are negative.     Allergies  Codeine and Shellfish allergy  Home Medications   Prior to Admission medications   Medication Sig Start Date End Date Taking? Authorizing Provider  ibuprofen (ADVIL,MOTRIN) 200 MG tablet Take 200-400 mg by mouth every 6 (six) hours as needed for headache, mild pain or moderate pain.   Yes Historical Provider, MD  HYDROcodone-acetaminophen (NORCO/VICODIN)  5-325 MG per tablet Take 2 tablets by mouth every 4 (four) hours as needed. 10/06/14   Roza Creamer Patel-Mills, PA-C  methocarbamol (ROBAXIN) 500 MG tablet Take 1 tablet (500 mg total) by mouth 2 (two) times daily. 10/06/14   Robbi Scurlock Patel-Mills, PA-C  naproxen (NAPROSYN) 500 MG tablet Take 1 tablet (500 mg total) by mouth 2 (two) times daily. 10/06/14   George Butler Patel-Mills, PA-C   BP 110/49 mmHg  Pulse 88  Temp(Src) 97.7 F (36.5 C) (Oral)  Resp 18  SpO2 99% Physical Exam  Constitutional: He is oriented to person, place, and time. He appears well-developed and well-nourished.  HENT:  Head: Normocephalic and atraumatic.  Eyes: Conjunctivae are normal.  Neck: Neck supple. Muscular tenderness present. No spinous process tenderness present. No rigidity. Decreased range of motion present.  Able to rotate the neck but minimal flexion and extension secondary to pain with movement of the neck.  He has right sided paravertebral tenderness to palpation but no midline cervical tenderness. Neck is supple. Pain is reproducible to palpation of the musculature.  Cardiovascular: Normal rate.   Pulmonary/Chest: Effort normal and breath sounds normal. No respiratory distress.  Abdominal: Soft. There is no tenderness. There is no rebound and no guarding.  Abdomen soft and nontender. No guarding or rebound.  Neurological: He is alert and oriented to person, place, and time.  Skin: Skin is warm and dry.  Psychiatric: He has a normal mood and affect. His behavior is normal.  Nursing note and vitals reviewed.  ED Course  Procedures (including critical care time) Labs Review Labs Reviewed - No data to display  Imaging Review No results found.    EKG Interpretation None      MDM   Final diagnoses:  Musculoskeletal neck pain  Patient presents for right sided neck pain after taking a deep breath yesterday. Vitals are stable and he is well-appearing. I believe his pain is musculoskeletal related since it is both  reproducible and worse with movement. I'm not concerned about meningitis since he is afebrile, without headache and in no acute distress. Medications  ondansetron (ZOFRAN-ODT) disintegrating tablet 4 mg (4 mg Oral Given 10/06/14 1249)  ketorolac (TORADOL) injection 60 mg (60 mg Intramuscular Given 10/06/14 1248)  diazepam (VALIUM) tablet 5 mg (5 mg Oral Given 10/06/14 1249)   Recheck: Patient is feeling slightly better.  I discussed that this may take several days to resolve. I gave the patient return precautions and he verbally agrees the plan. Rx: Robaxin, naproxen, hydrocodone #6    Catha Gosselin, PA-C 10/06/14 1446  Raeford Razor, MD 10/07/14 [phone]

## 2015-05-09 ENCOUNTER — Emergency Department (HOSPITAL_COMMUNITY)
Admission: EM | Admit: 2015-05-09 | Discharge: 2015-05-10 | Disposition: A | Discharge status: Home / Self Care | Payer: Self-pay | Attending: Emergency Medicine | Admitting: Emergency Medicine

## 2015-05-09 ENCOUNTER — Encounter (HOSPITAL_COMMUNITY): Payer: Self-pay | Admitting: Oncology

## 2015-05-09 DIAGNOSIS — R319 Hematuria, unspecified: Secondary | ICD-10-CM | POA: Insufficient documentation

## 2015-05-09 DIAGNOSIS — F17210 Nicotine dependence, cigarettes, uncomplicated: Secondary | ICD-10-CM | POA: Insufficient documentation

## 2015-05-09 NOTE — ED Notes (Signed)
Pt c/o blood in his urine.  Denies any urinary sx.  Started approximately 2 hours PTA.

## 2015-05-10 NOTE — ED Notes (Signed)
Pt called from triage, no answer 

## 2015-05-25 ENCOUNTER — Encounter (HOSPITAL_COMMUNITY): Payer: Self-pay | Admitting: *Deleted

## 2015-05-25 ENCOUNTER — Emergency Department (HOSPITAL_COMMUNITY)
Admission: EM | Admit: 2015-05-25 | Discharge: 2015-05-26 | Disposition: A | Discharge status: Home / Self Care | Payer: Self-pay | Attending: Emergency Medicine | Admitting: Emergency Medicine

## 2015-05-25 ENCOUNTER — Emergency Department (HOSPITAL_COMMUNITY): Payer: Self-pay

## 2015-05-25 DIAGNOSIS — R1030 Lower abdominal pain, unspecified: Secondary | ICD-10-CM | POA: Insufficient documentation

## 2015-05-25 DIAGNOSIS — R42 Dizziness and giddiness: Secondary | ICD-10-CM | POA: Insufficient documentation

## 2015-05-25 DIAGNOSIS — K6289 Other specified diseases of anus and rectum: Secondary | ICD-10-CM | POA: Insufficient documentation

## 2015-05-25 DIAGNOSIS — R634 Abnormal weight loss: Secondary | ICD-10-CM | POA: Insufficient documentation

## 2015-05-25 LAB — CBC WITH DIFFERENTIAL/PLATELET
Basophils Absolute: 0 10*3/uL (ref 0.0–0.1)
Basophils Relative: 0 %
EOS ABS: 0.1 10*3/uL (ref 0.0–0.7)
Eosinophils Relative: 1 %
HEMATOCRIT: 40.6 % (ref 39.0–52.0)
HEMOGLOBIN: 13.2 g/dL (ref 13.0–17.0)
LYMPHS ABS: 3 10*3/uL (ref 0.7–4.0)
LYMPHS PCT: 48 %
MCH: 28.3 pg (ref 26.0–34.0)
MCHC: 32.5 g/dL (ref 30.0–36.0)
MCV: 87.1 fL (ref 78.0–100.0)
Monocytes Absolute: 0.6 10*3/uL (ref 0.1–1.0)
Monocytes Relative: 9 %
NEUTROS ABS: 2.6 10*3/uL (ref 1.7–7.7)
NEUTROS PCT: 42 %
Platelets: 219 10*3/uL (ref 150–400)
RBC: 4.66 MIL/uL (ref 4.22–5.81)
RDW: 12.9 % (ref 11.5–15.5)
WBC: 6.2 10*3/uL (ref 4.0–10.5)

## 2015-05-25 LAB — COMPREHENSIVE METABOLIC PANEL
ALT: 17 U/L (ref 17–63)
ANION GAP: 10 (ref 5–15)
AST: 25 U/L (ref 15–41)
Albumin: 4.2 g/dL (ref 3.5–5.0)
Alkaline Phosphatase: 47 U/L (ref 38–126)
BUN: 7 mg/dL (ref 6–20)
CHLORIDE: 105 mmol/L (ref 101–111)
CO2: 25 mmol/L (ref 22–32)
Calcium: 9.3 mg/dL (ref 8.9–10.3)
Creatinine, Ser: 0.97 mg/dL (ref 0.61–1.24)
Glucose, Bld: 88 mg/dL (ref 65–99)
POTASSIUM: 3.2 mmol/L — AB (ref 3.5–5.1)
SODIUM: 140 mmol/L (ref 135–145)
Total Bilirubin: 0.9 mg/dL (ref 0.3–1.2)
Total Protein: 7 g/dL (ref 6.5–8.1)

## 2015-05-25 LAB — URINALYSIS, ROUTINE W REFLEX MICROSCOPIC
Glucose, UA: NEGATIVE mg/dL
Hgb urine dipstick: NEGATIVE
KETONES UR: 15 mg/dL — AB
LEUKOCYTES UA: NEGATIVE
NITRITE: NEGATIVE
PROTEIN: NEGATIVE mg/dL
Specific Gravity, Urine: 1.031 — ABNORMAL HIGH (ref 1.005–1.030)
pH: 6 (ref 5.0–8.0)

## 2015-05-25 LAB — RAPID HIV SCREEN (HIV 1/2 AB+AG)
HIV 1/2 Antibodies: NONREACTIVE
HIV-1 P24 ANTIGEN - HIV24: NONREACTIVE

## 2015-05-25 MED ORDER — SODIUM CHLORIDE 0.9 % IV BOLUS (SEPSIS)
1000.0000 mL | Freq: Once | INTRAVENOUS | Status: AC
  Administered 2015-05-25: 1000 mL via INTRAVENOUS

## 2015-05-25 NOTE — ED Notes (Signed)
Pt is here for dull right flank pain, dark urine, at end of urination he has a feeling of continued urgency.  No fever or chills with this.  Pt also reports some hematuria

## 2015-05-25 NOTE — ED Notes (Signed)
Pt also states that he has a "knot in his rectum, internal" per SO and pt reports hx of prostatitis

## 2015-05-26 ENCOUNTER — Emergency Department (HOSPITAL_COMMUNITY): Payer: Self-pay

## 2015-05-26 MED ORDER — IOPAMIDOL (ISOVUE-300) INJECTION 61%
INTRAVENOUS | Status: AC
  Administered 2015-05-26: 100 mL
  Filled 2015-05-26: qty 100

## 2015-05-26 NOTE — ED Notes (Signed)
Patient transported to CT 

## 2015-05-26 NOTE — Discharge Instructions (Signed)

## 2015-05-26 NOTE — ED Provider Notes (Signed)
CSN: [id number]     Arrival date & time 05/25/15  1855 History   First MD Initiated Contact with Patient 05/25/15 2142     Chief Complaint  Patient presents with  . Flank Pain     (Consider location/radiation/quality/duration/timing/severity/associated sxs/prior Treatment) The history is provided by the patient. No language interpreter was used.    35 y.o. male with Pmhx of prostatitis presents to the emergency department today for right sided flank pain and lower abdominal pain with associated weight loss, night sweats, and changes in urination. Over past month patient has unintentionally lost 32lbs and reports waking up covered in pool of sweat each morning. 3 weeks ago noticed a dull, non-progressive, constant, non-radiating right flank pain with associated nasuea. Notes no aggravating or relieving factors. 1 week ago, noticed bright red blood while urinating. Has since turned to a persistent dark, orange-brown color with associated incomplete voiding, urgency, and a constant dull suprapubic abdominal pain, non-radiating, non-aggravating. Over the past three days, experienced several episodes of non-bilious, non-blood emesis. Denies fever, chills, dysuria, polyuria, hesitancy, hesitancy, penile pain, penile d/c, testicular pain, melena, hematochezia, diarrhea, cough, hemoptysis, SOB, or CP.   History reviewed. No pertinent past medical history. Past Surgical History  Procedure Laterality Date  . Lung surgery     No family history on file. Social History  Substance Use Topics  . Smoking status: Current Every Day Smoker -- 0.50 packs/day    Types: Cigarettes  . Smokeless tobacco: Never Used  . Alcohol Use: No     Comment: 2-3 shots on the weekend    Review of Systems  Constitutional: Positive for unexpected weight change. Negative for fever.  HENT: Negative for congestion.   Respiratory: Negative for cough and shortness of breath.   Gastrointestinal: Positive for rectal pain.   Genitourinary: Positive for dysuria, hematuria, flank pain and difficulty urinating.  Musculoskeletal: Negative for myalgias.  Skin: Negative for color change and rash.  Neurological: Positive for light-headedness.  Psychiatric/Behavioral: Negative for confusion.      Allergies  Codeine and Shellfish allergy  Home Medications   Prior to Admission medications   Medication Sig Start Date End Date Taking? Authorizing Provider  HYDROcodone-acetaminophen (NORCO/VICODIN) 5-325 MG per tablet Take 2 tablets by mouth every 4 (four) hours as needed. Patient not taking: Reported on 05/25/2015 10/06/14   Catha GosselinHanna Patel-Mills, PA-C  methocarbamol (ROBAXIN) 500 MG tablet Take 1 tablet (500 mg total) by mouth 2 (two) times daily. Patient not taking: Reported on 05/25/2015 10/06/14   Catha GosselinHanna Patel-Mills, PA-C  naproxen (NAPROSYN) 500 MG tablet Take 1 tablet (500 mg total) by mouth 2 (two) times daily. Patient not taking: Reported on 05/25/2015 10/06/14   Hanna Patel-Mills, PA-C   BP 125/82 mmHg  Pulse 50  Temp(Src) 98.2 F (36.8 C) (Oral)  Resp 19  Ht 5\' 7"  (1.702 m)  Wt 78.472 kg  BMI 27.09 kg/m2  SpO2 98% Physical Exam  Constitutional: He is oriented to person, place, and time. He appears well-developed and well-nourished.  HENT:  Head: Normocephalic.  Neck: Normal range of motion. Neck supple.  Cardiovascular: Normal rate and regular rhythm.   Pulmonary/Chest: Effort normal and breath sounds normal. He has no wheezes. He has no rales.  Abdominal: Soft. Bowel sounds are normal. There is no tenderness. There is no rebound and no guarding.  Genitourinary: Prostate normal.  Musculoskeletal: Normal range of motion.  Neurological: He is alert and oriented to person, place, and time. Coordination normal.  Skin: Skin is warm  and dry. No rash noted.  Psychiatric: He has a normal mood and affect.    ED Course  Procedures (including critical care time) Labs Review Labs Reviewed  URINALYSIS, ROUTINE W  REFLEX MICROSCOPIC (NOT AT [facility]) - Abnormal; Notable for the following:    Color, Urine AMBER (*)    Specific Gravity, Urine 1.031 (*)    Bilirubin Urine SMALL (*)    Ketones, ur 15 (*)    All other components within normal limits  COMPREHENSIVE METABOLIC PANEL - Abnormal; Notable for the following:    Potassium 3.2 (*)    All other components within normal limits  CBC WITH DIFFERENTIAL/PLATELET  RAPID HIV SCREEN (HIV 1/2 AB+AG)   Results for orders placed or performed during the hospital encounter of 05/25/15  Urinalysis, Routine w reflex microscopic-may I&O cath if menses (not at [facility])  Result Value Ref Range   Color, Urine AMBER (A) YELLOW   APPearance CLEAR CLEAR   Specific Gravity, Urine 1.031 (H) 1.005 - 1.030   pH 6.0 5.0 - 8.0   Glucose, UA NEGATIVE NEGATIVE mg/dL   Hgb urine dipstick NEGATIVE NEGATIVE   Bilirubin Urine SMALL (A) NEGATIVE   Ketones, ur 15 (A) NEGATIVE mg/dL   Protein, ur NEGATIVE NEGATIVE mg/dL   Nitrite NEGATIVE NEGATIVE   Leukocytes, UA NEGATIVE NEGATIVE  Comprehensive metabolic panel  Result Value Ref Range   Sodium 140 135 - 145 mmol/L   Potassium 3.2 (L) 3.5 - 5.1 mmol/L   Chloride 105 101 - 111 mmol/L   CO2 25 22 - 32 mmol/L   Glucose, Bld 88 65 - 99 mg/dL   BUN 7 6 - 20 mg/dL   Creatinine, Ser 1.61 0.61 - 1.24 mg/dL   Calcium 9.3 8.9 - 09.6 mg/dL   Total Protein 7.0 6.5 - 8.1 g/dL   Albumin 4.2 3.5 - 5.0 g/dL   AST 25 15 - 41 U/L   ALT 17 17 - 63 U/L   Alkaline Phosphatase 47 38 - 126 U/L   Total Bilirubin 0.9 0.3 - 1.2 mg/dL   GFR calc non Af Amer >60 >60 mL/min   GFR calc Af Amer >60 >60 mL/min   Anion gap 10 5 - 15  CBC with Differential  Result Value Ref Range   WBC 6.2 4.0 - 10.5 K/uL   RBC 4.66 4.22 - 5.81 MIL/uL   Hemoglobin 13.2 13.0 - 17.0 g/dL   HCT 04.5 40.9 - 81.1 %   MCV 87.1 78.0 - 100.0 fL   MCH 28.3 26.0 - 34.0 pg   MCHC 32.5 30.0 - 36.0 g/dL   RDW 91.4 78.2 - 95.6 %   Platelets 219 150 - 400 K/uL   Neutrophils  Relative % 42 %   Neutro Abs 2.6 1.7 - 7.7 K/uL   Lymphocytes Relative 48 %   Lymphs Abs 3.0 0.7 - 4.0 K/uL   Monocytes Relative 9 %   Monocytes Absolute 0.6 0.1 - 1.0 K/uL   Eosinophils Relative 1 %   Eosinophils Absolute 0.1 0.0 - 0.7 K/uL   Basophils Relative 0 %   Basophils Absolute 0.0 0.0 - 0.1 K/uL  Rapid HIV screen (HIV 1/2 Ab+Ag)  Result Value Ref Range   HIV-1 P24 Antigen - HIV24 NON REACTIVE NON REACTIVE   HIV 1/2 Antibodies NON REACTIVE NON REACTIVE   Interpretation (HIV Ag Ab)      A non reactive test result means that HIV 1 or HIV 2 antibodies and HIV 1 p24 antigen  were not detected in the specimen.    Imaging Review Dg Chest 2 View  05/25/2015  CLINICAL DATA:  Abdominal pain EXAM: CHEST  2 VIEW COMPARISON:  02/05/2007 FINDINGS: Normal heart size and mediastinal contours. No acute infiltrate or edema. No effusion or pneumothorax. No acute osseous findings. IMPRESSION: Negative chest. Electronically Signed   By: Marnee Spring M.D.   On: 05/25/2015 22:52   Ct Abdomen Pelvis W Contrast  05/26/2015  CLINICAL DATA:  Subacute onset of right lower quadrant abdominal pain, nausea and vomiting. Initial encounter. EXAM: CT ABDOMEN AND PELVIS WITH CONTRAST TECHNIQUE: Multidetector CT imaging of the abdomen and pelvis was performed using the standard protocol following bolus administration of intravenous contrast. CONTRAST:  ISOVUE-300 IOPAMIDOL (ISOVUE-300) INJECTION 61% COMPARISON:  CT of the abdomen and pelvis performed 05/21/2005 FINDINGS: The visualized lung bases are clear. The liver and spleen are unremarkable in appearance. The gallbladder is within normal limits. The pancreas and adrenal glands are unremarkable. The kidneys are unremarkable in appearance. There is no evidence of hydronephrosis. No renal or ureteral stones are seen. No perinephric stranding is appreciated. No free fluid is identified. The small bowel is unremarkable in appearance. The stomach is within normal  limits. No acute vascular abnormalities are seen. The appendix is grossly normal in caliber, without evidence of appendicitis. The colon is unremarkable in appearance. The bladder is mildly distended and grossly unremarkable. The prostate remains normal in size. No inguinal lymphadenopathy is seen. No acute osseous abnormalities are identified. IMPRESSION: Unremarkable contrast-enhanced CT of the abdomen and pelvis. Electronically Signed   By: Roanna Raider M.D.   On: 05/26/2015 01:38   I have personally reviewed and evaluated these images and lab results as part of my medical decision-making.   EKG Interpretation None      MDM   Final diagnoses:  Lower abdominal pain  Weight loss    The patient's condition remains unchanged while in the ED. Concerning history for dramatic weight loss, night sweats and rectal pain and "mass". Work up here is essentially unremarkable, including lab studies, CXR and CT of abdomen and pelvis. VSS. The patient can be discharged and referred to [facility] for further outpatient evaluation.    Elpidio Anis, PA-C 05/26/15 1610  Rolan Bucco, MD 05/29/15 [phone]

## 2016-01-09 ENCOUNTER — Emergency Department (HOSPITAL_COMMUNITY)
Admission: EM | Admit: 2016-01-09 | Discharge: 2016-01-09 | Disposition: A | Discharge status: Home / Self Care | Payer: Self-pay | Attending: Emergency Medicine | Admitting: Emergency Medicine

## 2016-01-09 ENCOUNTER — Encounter (HOSPITAL_COMMUNITY): Payer: Self-pay | Admitting: Emergency Medicine

## 2016-01-09 DIAGNOSIS — F17210 Nicotine dependence, cigarettes, uncomplicated: Secondary | ICD-10-CM | POA: Insufficient documentation

## 2016-01-09 DIAGNOSIS — Z5321 Procedure and treatment not carried out due to patient leaving prior to being seen by health care provider: Secondary | ICD-10-CM | POA: Insufficient documentation

## 2016-01-09 DIAGNOSIS — R1030 Lower abdominal pain, unspecified: Secondary | ICD-10-CM | POA: Insufficient documentation

## 2016-01-09 LAB — URINALYSIS, ROUTINE W REFLEX MICROSCOPIC
BILIRUBIN URINE: NEGATIVE
GLUCOSE, UA: NEGATIVE mg/dL
KETONES UR: 5 mg/dL — AB
NITRITE: NEGATIVE
PH: 5 (ref 5.0–8.0)
Protein, ur: 30 mg/dL — AB
Specific Gravity, Urine: 1.031 — ABNORMAL HIGH (ref 1.005–1.030)
Squamous Epithelial / LPF: NONE SEEN

## 2016-01-09 NOTE — ED Notes (Signed)
Called to go back to a room patient no answer from lobby. Per registration patient and male visitor left.

## 2016-01-09 NOTE — ED Triage Notes (Signed)
Patient c/o lower back and bilat flank pain x 3 days. Patient states that his urine is dark and having less output. Patient states that he has been drinking normal.

## 2016-01-10 ENCOUNTER — Emergency Department (HOSPITAL_COMMUNITY)
Admission: EM | Admit: 2016-01-10 | Discharge: 2016-01-10 | Disposition: A | Discharge status: Home / Self Care | Payer: Self-pay | Attending: Emergency Medicine | Admitting: Emergency Medicine

## 2016-01-10 ENCOUNTER — Encounter (HOSPITAL_COMMUNITY): Payer: Self-pay | Admitting: *Deleted

## 2016-01-10 DIAGNOSIS — R102 Pelvic and perineal pain: Secondary | ICD-10-CM

## 2016-01-10 DIAGNOSIS — R8299 Other abnormal findings in urine: Secondary | ICD-10-CM | POA: Insufficient documentation

## 2016-01-10 DIAGNOSIS — R300 Dysuria: Secondary | ICD-10-CM

## 2016-01-10 DIAGNOSIS — Y999 Unspecified external cause status: Secondary | ICD-10-CM | POA: Insufficient documentation

## 2016-01-10 DIAGNOSIS — Y939 Activity, unspecified: Secondary | ICD-10-CM | POA: Insufficient documentation

## 2016-01-10 DIAGNOSIS — S39012A Strain of muscle, fascia and tendon of lower back, initial encounter: Secondary | ICD-10-CM | POA: Insufficient documentation

## 2016-01-10 DIAGNOSIS — F17210 Nicotine dependence, cigarettes, uncomplicated: Secondary | ICD-10-CM | POA: Insufficient documentation

## 2016-01-10 DIAGNOSIS — R1030 Lower abdominal pain, unspecified: Secondary | ICD-10-CM | POA: Insufficient documentation

## 2016-01-10 DIAGNOSIS — X58XXXA Exposure to other specified factors, initial encounter: Secondary | ICD-10-CM | POA: Insufficient documentation

## 2016-01-10 DIAGNOSIS — Y929 Unspecified place or not applicable: Secondary | ICD-10-CM | POA: Insufficient documentation

## 2016-01-10 DIAGNOSIS — R82998 Other abnormal findings in urine: Secondary | ICD-10-CM

## 2016-01-10 LAB — URINALYSIS, ROUTINE W REFLEX MICROSCOPIC
Glucose, UA: NEGATIVE mg/dL
KETONES UR: 15 mg/dL — AB
NITRITE: NEGATIVE
Protein, ur: NEGATIVE mg/dL
pH: 5.5 (ref 5.0–8.0)

## 2016-01-10 LAB — URINALYSIS, MICROSCOPIC (REFLEX)

## 2016-01-10 LAB — COMPREHENSIVE METABOLIC PANEL
ALBUMIN: 4.5 g/dL (ref 3.5–5.0)
ALK PHOS: 51 U/L (ref 38–126)
ALT: 18 U/L (ref 17–63)
ANION GAP: 9 (ref 5–15)
AST: 25 U/L (ref 15–41)
BILIRUBIN TOTAL: 0.8 mg/dL (ref 0.3–1.2)
BUN: 10 mg/dL (ref 6–20)
CALCIUM: 9.5 mg/dL (ref 8.9–10.3)
CO2: 26 mmol/L (ref 22–32)
Chloride: 105 mmol/L (ref 101–111)
Creatinine, Ser: 1 mg/dL (ref 0.61–1.24)
GLUCOSE: 97 mg/dL (ref 65–99)
POTASSIUM: 3.1 mmol/L — AB (ref 3.5–5.1)
Sodium: 140 mmol/L (ref 135–145)
TOTAL PROTEIN: 7.9 g/dL (ref 6.5–8.1)

## 2016-01-10 LAB — CBC
HEMATOCRIT: 43.8 % (ref 39.0–52.0)
HEMOGLOBIN: 14.8 g/dL (ref 13.0–17.0)
MCH: 28.6 pg (ref 26.0–34.0)
MCHC: 33.8 g/dL (ref 30.0–36.0)
MCV: 84.7 fL (ref 78.0–100.0)
Platelets: 195 10*3/uL (ref 150–400)
RBC: 5.17 MIL/uL (ref 4.22–5.81)
RDW: 12.9 % (ref 11.5–15.5)
WBC: 5.9 10*3/uL (ref 4.0–10.5)

## 2016-01-10 LAB — LIPASE, BLOOD: Lipase: 23 U/L (ref 11–51)

## 2016-01-10 MED ORDER — CIPROFLOXACIN HCL 500 MG PO TABS: 500.0000 mg | ORAL_TABLET | Freq: Two times a day (BID) | ORAL | 0 refills | Status: AC

## 2016-01-10 MED ORDER — ACETAMINOPHEN 500 MG PO TABS
1000.0000 mg | ORAL_TABLET | Freq: Once | ORAL | Status: AC
  Administered 2016-01-10: 1000 mg via ORAL
  Filled 2016-01-10: qty 2

## 2016-01-10 MED ORDER — CIPROFLOXACIN HCL 500 MG PO TABS
500.0000 mg | ORAL_TABLET | Freq: Once | ORAL | Status: AC
  Administered 2016-01-10: 500 mg via ORAL
  Filled 2016-01-10: qty 1

## 2016-01-10 NOTE — ED Provider Notes (Signed)
[facility]-EMERGENCY DEPT Provider Note   CSN: [id number] Arrival date & time: 01/10/16  1141   By signing my name below, I, [name], attest that this documentation has been prepared under the direction and in the presence of Nira ConnPedro Eduardo Quintus Premo, MD.  Electronically Signed: Octavia HeirArianna Nassar, ED Scribe. 01/10/16. 1:24 PM.   History   Chief Complaint Chief Complaint  Patient presents with  . Abdominal Pain  . Back Pain    The history is provided by the patient. No language interpreter was used.   HPI Comments: George Butler is a 35 y.o. male who presents to the Emergency Department complaining of waxing and waning, gradual worsening, bilateral lower back pain x 4 days. He describes his pain as sharp when it is at the highest and then dull when it the pain is low. Pt notes associated suprapubic abdominal pain, nausea, one episode of vomiting, and difficulty urinating. He further reports one episode of dysuria that caused him to have a sharp pain in his penis that "felt like something coming through my urethra". Pt has not had a bowel movement since this pain started. He has a hx of UTI about to years ago. Pt is sexually active and the last time he had unprotected intercourse was 5 days ago. He does not engage in anal penetration. Pt has been taking ibuprofen and tylenol to relieve his pain. He has no past abdominal surgical hx. He denies penile discharge, fever, diarrhea, testicular swelling, bowel or bladder incontinence.  History reviewed. No pertinent past medical history.  There are no active problems to display for this patient.   Past Surgical History:  Procedure Laterality Date  . LUNG SURGERY         Home Medications    Prior to Admission medications   Medication Sig Start Date End Date Taking? Authorizing Provider  ciprofloxacin (CIPRO) 500 MG tablet Take 1 tablet (500 mg total) by mouth 2 (two) times daily. 01/10/16 01/17/16  Nira ConnPedro Eduardo Jameya Pontiff, MD   HYDROcodone-acetaminophen (NORCO/VICODIN) 5-325 MG per tablet Take 2 tablets by mouth every 4 (four) hours as needed. Patient not taking: Reported on 05/25/2015 10/06/14   Catha GosselinHanna Patel-Mills, PA-C  methocarbamol (ROBAXIN) 500 MG tablet Take 1 tablet (500 mg total) by mouth 2 (two) times daily. Patient not taking: Reported on 05/25/2015 10/06/14   Catha GosselinHanna Patel-Mills, PA-C  naproxen (NAPROSYN) 500 MG tablet Take 1 tablet (500 mg total) by mouth 2 (two) times daily. Patient not taking: Reported on 05/25/2015 10/06/14   Catha GosselinHanna Patel-Mills, PA-C    Family History History reviewed. No pertinent family history.  Social History Social History  Substance Use Topics  . Smoking status: Current Every Day Smoker    Packs/day: 0.50    Types: Cigarettes  . Smokeless tobacco: Never Used  . Alcohol use No     Comment: 2-3 shots on the weekend     Allergies   Codeine and Shellfish allergy   Review of Systems Review of Systems  A complete 10 system review of systems was obtained and all systems are negative except as noted in the HPI and PMH.    Physical Exam Updated Vital Signs BP 130/89 (BP Location: Right Arm)   Pulse 118   Temp 98 F (36.7 C) (Oral)   Resp 16   Ht 5\' 8"  (1.727 m)   Wt 168 lb 14.4 oz (76.6 kg)   SpO2 99%   BMI 25.68 kg/m   Physical Exam  Constitutional: He is oriented to person,  place, and time. He appears well-developed and well-nourished. No distress.  Appears uncomfortable  HENT:  Head: Normocephalic and atraumatic.  Nose: Nose normal.  Eyes: Conjunctivae and EOM are normal. Pupils are equal, round, and reactive to light. Right eye exhibits no discharge. Left eye exhibits no discharge. No scleral icterus.  Neck: Normal range of motion. Neck supple.  Cardiovascular: Normal rate and regular rhythm.  Exam reveals no gallop and no friction rub.   No murmur heard. Pulmonary/Chest: Effort normal and breath sounds normal. No stridor. No respiratory distress. He has no  rales.  Abdominal: Soft. He exhibits no distension. There is tenderness.  Suprapubic TTP  Genitourinary: Prostate normal.  Genitourinary Comments: Prostate is normal size  Musculoskeletal: He exhibits tenderness. He exhibits no edema.  TTP to bilateral paraspinal muscles of thoracic and lumbar spine.   Neurological: He is alert and oriented to person, place, and time.  Skin: Skin is warm and dry. No rash noted. He is not diaphoretic. No erythema.  Psychiatric: He has a normal mood and affect.  Nursing note and vitals reviewed.    ED Treatments / Results  DIAGNOSTIC STUDIES: Oxygen Saturation is 99% on RA, normal by my interpretation.  COORDINATION OF CARE:  1:18 PM Discussed treatment plan with pt at bedside and pt agreed to plan.  Labs (all labs ordered are listed, but only abnormal results are displayed) Labs Reviewed  COMPREHENSIVE METABOLIC PANEL - Abnormal; Notable for the following:       Result Value   Potassium 3.1 (*)    All other components within normal limits  URINALYSIS, ROUTINE W REFLEX MICROSCOPIC - Abnormal; Notable for the following:    APPearance HAZY (*)    Specific Gravity, Urine >1.030 (*)    Hgb urine dipstick MODERATE (*)    Bilirubin Urine SMALL (*)    Ketones, ur 15 (*)    Leukocytes, UA SMALL (*)    All other components within normal limits  URINALYSIS, MICROSCOPIC (REFLEX) - Abnormal; Notable for the following:    Bacteria, UA FEW (*)    Squamous Epithelial / LPF 0-5 (*)    All other components within normal limits  LIPASE, BLOOD  CBC  GC/CHLAMYDIA PROBE AMP ([place]) [facility]    EKG  EKG Interpretation None       Radiology No results found.  Procedures Procedures (including critical care time)  Emergency Focused Ultrasound Exam Limited Retroperitoneal Ultrasound of Kidneys and Bladder  Performed and interpreted by Dr. Eudelia Bunchardama Focused abdominal ultrasound with both kidneys and bladder imaged in transverse and longitudinal  planes in real-time. Indication: flank pain Findings: bilateral kidneys present, no shadowing, no anechoic areas Interpretation: no hydronephrosis visualized.  no stones or cysts visualized  Images archived electronically  CPT Code: 7829576775    Emergency Focused Ultrasound Exam Limited ultrasound of the Bladder.   Performed and interpreted by Dr. Eudelia Bunchardama Indication: evaluation for urinary retention Transverse and Sagittal views of the bladder are obtained and calculations are performed to determine an estimated bladder volume for signs of post-renal obstruction.  Findings: Bladder is not full.  Interpretation: no evidence of outlet obstruction Images archived electronically.  CPT Codes: [id number] and [phone]    Medications Ordered in ED Medications  acetaminophen (TYLENOL) tablet 1,000 mg (1,000 mg Oral Given 01/10/16 1359)  ciprofloxacin (CIPRO) tablet 500 mg (500 mg Oral Given 01/10/16 1401)     Initial Impression / Assessment and Plan / ED Course  I have reviewed the triage vital signs  and the nursing notes.  Pertinent labs & imaging results that were available during my care of the patient were reviewed by me and considered in my medical decision making (see chart for details).  Clinical Course as of Jan 10 1403  Wed Jan 10, 2016  1353 Glucose: NEGATIVE [PC]    Clinical Course User Index [PC] Nira Conn, MD    Given h/o of prior UTI with questionable UA, theres a concern for possible pyelonephritis although lumbar and thoracic muscle strain is more likely. With CaOx crystals in UA, urinary stones also possible. No hydro or bladder obstruction on POCUS. No suspicion for STI per patient. Will treat for possible pyelo and send urine for culture and GC/Chlam.  Low suspicion for appendicitis, diverticulitis, or SBO.  The patient is safe for discharge with strict return precautions.   Final Clinical Impressions(s) / ED Diagnoses   Final diagnoses:  Dysuria   Calcium oxalate crystals in urine  Strain of lumbar region, initial encounter  Suprapubic pain   Disposition: Discharge  Condition: Good  I have discussed the results, Dx and Tx plan with the patient who expressed understanding and agree(s) with the plan. Discharge instructions discussed at great length. The patient was given strict return precautions who verbalized understanding of the instructions. No further questions at time of discharge.    Discharge Medication List as of 01/10/2016  1:59 PM    START taking these medications   Details  ciprofloxacin (CIPRO) 500 MG tablet Take 1 tablet (500 mg total) by mouth 2 (two) times daily., Starting Wed 01/10/2016, Until Wed 01/17/2016, Print        Follow Up: [facility] [address] [place] [address] [phone] Call  For help establishing care with a care provider   I personally performed the services described in this documentation, which was scribed in my presence. The recorded information has been reviewed and is accurate.          Nira Conn, MD 01/10/16 [phone]

## 2016-01-10 NOTE — ED Triage Notes (Signed)
Pt reports bilateral lower back/side pain x 3-4 days. Reports sharp abd pain, nausea and only able to urinate small amounts.

## 2017-03-14 ENCOUNTER — Other Ambulatory Visit: Payer: Self-pay

## 2017-03-14 ENCOUNTER — Emergency Department (HOSPITAL_COMMUNITY): Payer: Self-pay

## 2017-03-14 ENCOUNTER — Emergency Department (HOSPITAL_COMMUNITY)
Admission: EM | Admit: 2017-03-14 | Discharge: 2017-03-14 | Disposition: A | Discharge status: Home / Self Care | Payer: Self-pay | Attending: Emergency Medicine | Admitting: Emergency Medicine

## 2017-03-14 ENCOUNTER — Encounter (HOSPITAL_COMMUNITY): Payer: Self-pay | Admitting: Emergency Medicine

## 2017-03-14 DIAGNOSIS — G030 Nonpyogenic meningitis: Secondary | ICD-10-CM

## 2017-03-14 DIAGNOSIS — A2781 Aseptic meningitis in leptospirosis: Secondary | ICD-10-CM | POA: Insufficient documentation

## 2017-03-14 DIAGNOSIS — F17210 Nicotine dependence, cigarettes, uncomplicated: Secondary | ICD-10-CM | POA: Insufficient documentation

## 2017-03-14 LAB — CBC WITH DIFFERENTIAL/PLATELET
BASOS PCT: 0 %
Basophils Absolute: 0 10*3/uL (ref 0.0–0.1)
Eosinophils Absolute: 0 10*3/uL (ref 0.0–0.7)
Eosinophils Relative: 0 %
HEMATOCRIT: 43.2 % (ref 39.0–52.0)
Hemoglobin: 14.4 g/dL (ref 13.0–17.0)
Lymphocytes Relative: 37 %
Lymphs Abs: 2.7 10*3/uL (ref 0.7–4.0)
MCH: 28.7 pg (ref 26.0–34.0)
MCHC: 33.3 g/dL (ref 30.0–36.0)
MCV: 86.1 fL (ref 78.0–100.0)
MONO ABS: 0.5 10*3/uL (ref 0.1–1.0)
MONOS PCT: 7 %
Neutro Abs: 4 10*3/uL (ref 1.7–7.7)
Neutrophils Relative %: 56 %
Platelets: 203 10*3/uL (ref 150–400)
RBC: 5.02 MIL/uL (ref 4.22–5.81)
RDW: 13.5 % (ref 11.5–15.5)
WBC: 7.3 10*3/uL (ref 4.0–10.5)

## 2017-03-14 LAB — CSF CELL COUNT WITH DIFFERENTIAL
Eosinophils, CSF: 1 % (ref 0–1)
LYMPHS CSF: 90 % — AB (ref 40–80)
Lymphs, CSF: 81 % — ABNORMAL HIGH (ref 40–80)
MONOCYTE-MACROPHAGE-SPINAL FLUID: 2 % — AB (ref 15–45)
Monocyte-Macrophage-Spinal Fluid: 15 % (ref 15–45)
RBC COUNT CSF: 2 /mm3 — AB
RBC COUNT CSF: 9 /mm3 — AB
SEGMENTED NEUTROPHILS-CSF: 3 % (ref 0–6)
Segmented Neutrophils-CSF: 8 % — ABNORMAL HIGH (ref 0–6)
Tube #: 1
Tube #: 4
WBC CSF: 350 /mm3 — AB (ref 0–5)
WBC CSF: 565 /mm3 — AB (ref 0–5)

## 2017-03-14 LAB — BASIC METABOLIC PANEL
Anion gap: 12 (ref 5–15)
BUN: 9 mg/dL (ref 6–20)
CALCIUM: 9.3 mg/dL (ref 8.9–10.3)
CO2: 24 mmol/L (ref 22–32)
CREATININE: 1.04 mg/dL (ref 0.61–1.24)
Chloride: 104 mmol/L (ref 101–111)
GFR calc non Af Amer: >60 mL/min (ref >60)
GLUCOSE: 96 mg/dL (ref 65–99)
Potassium: 3.6 mmol/L (ref 3.5–5.1)
Sodium: 140 mmol/L (ref 135–145)

## 2017-03-14 LAB — INFLUENZA PANEL BY PCR (TYPE A & B)
Influenza A By PCR: NEGATIVE
Influenza B By PCR: NEGATIVE

## 2017-03-14 LAB — GLUCOSE, CSF: Glucose, CSF: 50 mg/dL (ref 40–70)

## 2017-03-14 LAB — PROTEIN, CSF: TOTAL PROTEIN, CSF: 84 mg/dL — AB (ref 15–45)

## 2017-03-14 MED ORDER — ONDANSETRON 4 MG PO TBDP: 4.0000 mg | ORAL_TABLET | Freq: Three times a day (TID) | ORAL | 0 refills | Status: DC | PRN

## 2017-03-14 MED ORDER — FENTANYL CITRATE (PF) 100 MCG/2ML IJ SOLN
50.0000 ug | Freq: Once | INTRAMUSCULAR | Status: AC
  Administered 2017-03-14: 50 ug via INTRAVENOUS
  Filled 2017-03-14: qty 2

## 2017-03-14 MED ORDER — LIDOCAINE HCL (PF) 1 % IJ SOLN
INTRAMUSCULAR | Status: AC
  Filled 2017-03-14: qty 30

## 2017-03-14 MED ORDER — PROMETHAZINE HCL 25 MG/ML IJ SOLN
25.0000 mg | Freq: Once | INTRAMUSCULAR | Status: AC
  Administered 2017-03-14: 25 mg via INTRAVENOUS
  Filled 2017-03-14: qty 1

## 2017-03-14 MED ORDER — LIDOCAINE HCL (PF) 1 % IJ SOLN
5.0000 mL | Freq: Once | INTRAMUSCULAR | Status: AC
  Administered 2017-03-14: 5 mL

## 2017-03-14 MED ORDER — HYDROCODONE-ACETAMINOPHEN 5-325 MG PO TABS: 1.0000 | ORAL_TABLET | Freq: Four times a day (QID) | ORAL | 0 refills | Status: DC | PRN

## 2017-03-14 MED ORDER — SODIUM CHLORIDE 0.9 % IV BOLUS (SEPSIS)
500.0000 mL | Freq: Once | INTRAVENOUS | Status: AC
  Administered 2017-03-14: 500 mL via INTRAVENOUS

## 2017-03-14 MED ORDER — SODIUM CHLORIDE 0.9 % IV BOLUS (SEPSIS)
1000.0000 mL | Freq: Once | INTRAVENOUS | Status: AC
  Administered 2017-03-14: 1000 mL via INTRAVENOUS

## 2017-03-14 MED ORDER — KETOROLAC TROMETHAMINE 30 MG/ML IJ SOLN
30.0000 mg | Freq: Once | INTRAMUSCULAR | Status: AC
  Administered 2017-03-14: 30 mg via INTRAVENOUS
  Filled 2017-03-14: qty 1

## 2017-03-14 NOTE — ED Provider Notes (Signed)
[facility] EMERGENCY DEPARTMENT Provider Note   CSN: [id number] Arrival date & time: 03/14/17  1328     History   Chief Complaint Chief Complaint  Patient presents with  . Headache    HPI Tao Satz is a 37 y.o. male.  HPI Patient presents with headache.  Began around 6 days ago.   began somewhat gradually.  It is pounding on his head.  Is also in his neck and upper back.  Has had reported fevers and chills at home although he did not take his temperature.  Has had nausea and vomiting.  No vision changes although does have some photophobia.  No cough.  No abdominal pain.  No sick contacts. History reviewed. No pertinent past medical history.  There are no active problems to display for this patient.   Past Surgical History:  Procedure Laterality Date  . LUNG SURGERY         Home Medications    Prior to Admission medications   Medication Sig Start Date End Date Taking? Authorizing Provider  ibuprofen (ADVIL,MOTRIN) 200 MG tablet Take 200 mg by mouth every 6 (six) hours as needed for headache (pain).   Yes [provider]  HYDROcodone-acetaminophen (NORCO/VICODIN) 5-325 MG per tablet Take 2 tablets by mouth every 4 (four) hours as needed. Patient not taking: Reported on 05/25/2015 10/06/14   Patel-Mills, Lorelle Formosa, PA-C  methocarbamol (ROBAXIN) 500 MG tablet Take 1 tablet (500 mg total) by mouth 2 (two) times daily. Patient not taking: Reported on 05/25/2015 10/06/14   Patel-Mills, Lorelle Formosa, PA-C  naproxen (NAPROSYN) 500 MG tablet Take 1 tablet (500 mg total) by mouth 2 (two) times daily. Patient not taking: Reported on 05/25/2015 10/06/14   Catha Gosselin, PA-C    Family History History reviewed. No pertinent family history.  Social History Social History   Tobacco Use  . Smoking status: Current Every Day Smoker    Packs/day: 0.50    Types: Cigarettes  . Smokeless tobacco: Never Used  Substance Use Topics  . Alcohol use: No     Comment: 2-3 shots on the weekend  . Drug use: Yes    Types: Marijuana     Allergies   Codeine; Fish allergy; and Shellfish allergy   Review of Systems Review of Systems  Constitutional: Positive for appetite change and chills.  HENT: Negative for congestion.   Eyes: Positive for photophobia.  Respiratory: Negative for shortness of breath.   Cardiovascular: Negative for chest pain.  Gastrointestinal: Positive for nausea and vomiting.  Genitourinary: Negative for flank pain.  Musculoskeletal: Negative for back pain.  Neurological: Positive for headaches.  Hematological: Negative for adenopathy.     Physical Exam Updated Vital Signs BP (!) 145/80   Pulse (!) 57   Temp 98.5 F (36.9 C) (Oral)   Resp (!) 22   Ht 5\' 7"  (1.702 m)   Wt 74.8 kg (165 lb)   SpO2 99%   BMI 25.84 kg/m   Physical Exam  Constitutional: He appears well-developed.  HENT:  Head: Normocephalic.  Eyes: Pupils are equal, round, and reactive to light.  Neck: Neck supple.  Good rotation and lateral movement of his neck.  Some tenderness over trapezius bilaterally.  Some pain with flexion of the neck.  No pain with straight leg raise or flexing at the hip and straightening the knee.  Cardiovascular: Normal rate.  Pulmonary/Chest: Effort normal.  Abdominal: Soft. There is no tenderness.  Musculoskeletal: He exhibits no edema.  Neurological: He is  alert. He has normal strength.  Skin: Skin is warm. Capillary refill takes less than 2 seconds.     ED Treatments / Results  Labs (all labs ordered are listed, but only abnormal results are displayed) Labs Reviewed  CSF CELL COUNT WITH DIFFERENTIAL - Abnormal; Notable for the following components:      Result Value   RBC Count, CSF 9 (*)    WBC, CSF 350 (*)    Lymphs, CSF 81 (*)    All other components within normal limits  CSF CELL COUNT WITH DIFFERENTIAL - Abnormal; Notable for the following components:   RBC Count, CSF 2 (*)    WBC, CSF 565 (*)     Segmented Neutrophils-CSF 8 (*)    Lymphs, CSF 90 (*)    Monocyte-Macrophage-Spinal Fluid 2 (*)    All other components within normal limits  PROTEIN, CSF - Abnormal; Notable for the following components:   Total  Protein, CSF 84 (*)    All other components within normal limits  CSF CULTURE  CBC WITH DIFFERENTIAL/PLATELET  BASIC METABOLIC PANEL  INFLUENZA PANEL BY PCR (TYPE A & B)  GLUCOSE, CSF  HIV ANTIBODY (ROUTINE TESTING)  CRYPTOCOCCAL ANTIGEN, CSF  HSV(HERPES SMPLX VRS)ABS-I+II(IGG)-CSF  VDRL, CSF    EKG  EKG Interpretation None       Radiology Ct Head Wo Contrast  Result Date: 03/14/2017 CLINICAL DATA:  Posterior headache for 3 days. EXAM: CT HEAD WITHOUT CONTRAST TECHNIQUE: Contiguous axial images were obtained from the base of the skull through the vertex without intravenous contrast. COMPARISON:  12/02/2006. FINDINGS: Brain: There is no evidence of acute infarct, intracranial hemorrhage, mass, midline shift, or extra-axial fluid collection. The ventricles and sulci are normal in size. An incidental cavum septum pellucidum et vergae is noted. Vascular: No hyperdense vessel. Skull: No fracture or focal osseous lesion. Sinuses/Orbits: Right maxillary sinus mucous retention cyst. Clear mastoid air cells. Unremarkable orbits. Other: None. IMPRESSION: No acute intracranial abnormality. Negative CT appearance of the brain. Electronically Signed   By: Sebastian Ache M.D.   On: 03/14/2017 16:27    Procedures .Lumbar Puncture Date/Time: 03/14/2017 10:08 PM Performed by: Benjiman Core, MD Authorized by: Benjiman Core, MD   Consent:    Consent obtained:  Verbal   Consent given by:  Patient   Risks discussed:  Bleeding, infection, pain, nerve damage and headache   Alternatives discussed:  No treatment, delayed treatment and alternative treatment Pre-procedure details:    Procedure purpose:  Diagnostic   Preparation: Patient was prepped and draped in usual sterile  fashion   Anesthesia (see MAR for exact dosages):    Anesthesia method:  Local infiltration   Local anesthetic:  Lidocaine 1% w/o epi Procedure details:    Lumbar space:  L3-L4 interspace   Patient position:  Sitting   Needle gauge:  20   Ultrasound guidance: no     Number of attempts:  2   Fluid appearance:  Clear   Tubes of fluid:  4   Total volume (ml):  4 Post-procedure:    Puncture site:  Adhesive bandage applied and direct pressure applied   Patient tolerance of procedure:  Tolerated well, no immediate complications   (including critical care time)  Medications Ordered in ED Medications  sodium chloride 0.9 % bolus 1,000 mL (0 mLs Intravenous Stopped 03/14/17 1742)  promethazine (PHENERGAN) injection 25 mg (25 mg Intravenous Given 03/14/17 1646)  sodium chloride 0.9 % bolus 500 mL (0 mLs Intravenous Stopped 03/14/17 1912)  lidocaine (PF) (XYLOCAINE) 1 % injection 5 mL (5 mLs Infiltration Given 03/14/17 1915)  fentaNYL (SUBLIMAZE) injection 50 mcg (50 mcg Intravenous Given 03/14/17 1933)  ketorolac (TORADOL) 30 MG/ML injection 30 mg (30 mg Intravenous Given 03/14/17 1933)     Initial Impression / Assessment and Plan / ED Course  I have reviewed the triage vital signs and the nursing notes.  Pertinent labs & imaging results that were available during my care of the patient were reviewed by me and considered in my medical decision making (see chart for details).     Patient with headache for the last 6 days.  Has some nausea and vomiting.  Has had chills without fevers.  CBC reassuring.  Head CT reassuring.  Lumbar puncture done and showed aseptic meningitis.  Discussed with Dr. Ainsley SpinnerVandamm from infectious disease.  More lab work has been added to the CSF and blood.  He will help follow the results as an outpatient and patient can follow in the clinic.  Will discharge home.  Does not appear to be bacterial meningitis at this time.    Final Clinical Impressions(s) / ED Diagnoses    Final diagnoses:  Aseptic meningitis    ED Discharge Orders    None       Benjiman CorePickering, Mishayla Sliwinski, MD 03/14/17 2208

## 2017-03-14 NOTE — ED Triage Notes (Addendum)
Pt states that he has had a headache for 6 days.  No hx of headaches.  Denies hitting head, blurred vision, seeing spots, or getting dizzy.  Pt started vomiting 2 days ago.  Last vomited at 0300 this morning when he tried to eat something.  Points to the top of his head when asked where he hurts.  States that he has had neck and back "stiffness". Presenting with nuchal rigidity now. States he is unable to flex his chin to his chest.  Subjective fevers at home.  Afebrile now.

## 2017-03-14 NOTE — ED Notes (Signed)
Patient transported to CT 

## 2017-03-15 LAB — HIV ANTIBODY (ROUTINE TESTING W REFLEX): HIV SCREEN 4TH GENERATION: NONREACTIVE

## 2017-03-15 LAB — CRYPTOCOCCAL ANTIGEN, CSF: CRYPTO AG: NEGATIVE

## 2017-03-17 LAB — PATHOLOGIST SMEAR REVIEW

## 2017-03-18 LAB — CSF CULTURE: CULTURE: NO GROWTH

## 2017-03-18 LAB — CSF CULTURE W GRAM STAIN

## 2017-03-19 LAB — VDRL, CSF: VDRL Quant, CSF: NONREACTIVE

## 2017-03-20 LAB — HSV(HERPES SMPLX VRS)ABS-I+II(IGG)-CSF: HSV TYPE I/II AB, IGG CSF: 0.86 IV (ref <=0.89)

## 2017-03-27 ENCOUNTER — Telehealth: Payer: Self-pay | Admitting: *Deleted

## 2017-03-27 NOTE — Telephone Encounter (Signed)
-----   Message from Randall Hissornelius N Van Dam, MD sent at 03/26/2017  3:02 PM EST ----- Regarding: RE: aseptic meningitis patient ALl of hit labs looked OK. He didn't have an HSV PCR to let us know if this was HSV 2 meningitis but it certainly could have been. I will ask Feliz Beamravis to check in on him and make sure he is ok. If he is better there is probably no need for him to be seen by us unless he wants to go through discussion of what this might have been ----- Message ----- From: Benjiman CorePickering, Nathan, MD Sent: 03/14/2017  10:02 PM To: Randall Hissornelius N Van Dam, MD Subject: aseptic meningitis patient                     Here is the patient that I discussed with you that has aseptic meningitis.  Thanks,  [facility] Pickering

## 2017-03-27 NOTE — Telephone Encounter (Signed)
Per George Butler called the patient to check in on him and had to leave a message for him to call the office when he can.

## 2017-04-07 NOTE — Telephone Encounter (Signed)
Perfect

## 2017-04-07 NOTE — Telephone Encounter (Signed)
Called the patient and he advised he feels "good" he states no fever, chills or sweats. He has had no headaches but still has some stiffness in his neck. Advised him will let the provider know and call him back if there is anything we need him to know.   Advised him to seek medical treatment if neck gets worse with fever headache or shortness of breath.

## 2021-05-17 ENCOUNTER — Ambulatory Visit (HOSPITAL_COMMUNITY)
Admission: EM | Admit: 2021-05-17 | Discharge: 2021-05-17 | Disposition: A | Discharge status: Home / Self Care | Payer: Commercial Managed Care - PPO | Attending: Emergency Medicine | Admitting: Emergency Medicine

## 2021-05-17 ENCOUNTER — Encounter (HOSPITAL_COMMUNITY): Payer: Self-pay | Admitting: Emergency Medicine

## 2021-05-17 ENCOUNTER — Ambulatory Visit (INDEPENDENT_AMBULATORY_CARE_PROVIDER_SITE_OTHER): Payer: Commercial Managed Care - PPO

## 2021-05-17 DIAGNOSIS — S60032A Contusion of left middle finger without damage to nail, initial encounter: Secondary | ICD-10-CM | POA: Diagnosis not present

## 2021-05-17 DIAGNOSIS — M79645 Pain in left finger(s): Secondary | ICD-10-CM

## 2021-05-17 MED ORDER — IBUPROFEN 600 MG PO TABS: 600.0000 mg | ORAL_TABLET | Freq: Four times a day (QID) | ORAL | 0 refills | Status: DC | PRN

## 2021-05-17 NOTE — ED Triage Notes (Signed)
Pt is present today with swelling and pain in his left middle finger. Pt states that a medal bar hit his finger two weeks ago at his job. Pt states that he cannot bend his finger probably.  ?

## 2021-05-17 NOTE — ED Provider Notes (Signed)
HPI ? ?SUBJECTIVE: ? ?George Butler is a right-handed 40 y.o. male who presents with constant, throbbing, daily left middle finger pain, swelling after smacking it with a metal rod 2.5 weeks ago while at work.  He states the swelling is not resolving.  He reports limitation of motion secondary to the pain.  He had tingling, but this has resolved.  No numbness.  He denies other injury to the hand.  He has taken ibuprofen 400 mg twice daily and doing ice water baths without improvement in his symptoms.  Symptoms are worse with use.  He has no past medical history.  PCP: He is establishing care on 5/4.  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 ?History reviewed. No pertinent past medical history. ? ?Past Surgical History:  ?Procedure Laterality Date  ? LUNG SURGERY    ? ? ?History reviewed. No pertinent family history. ? ?Social History  ? ?Tobacco Use  ? Smoking status: Every Day  ?  Packs/day: 0.50  ?  Types: Cigarettes  ? Smokeless tobacco: Never  ?Substance Use Topics  ? Alcohol use: No  ?  Comment: 2-3 shots on the weekend  ? Drug use: Yes  ?  Types: Marijuana  ? ? ?No current facility-administered medications for this encounter. ? ?Current Outpatient Medications:  ?  ibuprofen (ADVIL) 600 MG tablet, Take 1 tablet (600 mg total) by mouth every 6 (six) hours as needed., Disp: 30 tablet, Rfl: 0 ? ?Allergies  ?Allergen Reactions  ? Codeine Hives  ? Fish Allergy Swelling and Rash  ?  Facial swelling  ? Shellfish Allergy Swelling and Rash  ?  Facial swelling  ? ? ? ?ROS ? ?As noted in HPI.  ? ?Physical Exam ? ?BP 127/88   Pulse 75   Temp 97.7 ?F (36.5 ?C)   Resp 17   SpO2 99%  ? ?Constitutional: Well developed, well nourished, no acute distress ?Eyes:  EOMI, conjunctiva normal bilaterally ?HENT: Normocephalic, atraumatic,mucus membranes moist ?Respiratory: Normal inspiratory effort ?Cardiovascular: Normal rate ?GI: nondistended ?skin: No rash, skin intact ?Musculoskeletal: Diffuse swelling, tenderness  left middle finger, primarily at the PIP, middle phalanx.  MCP, proximal phalanx, PIP, distal phalanx nontender.  Cap refill less than 2 seconds.  Two-point discrimination intact.  Limited flexion at the DIP, PIP secondary to pain.  Rest of the hand, wrist nontender.  No evidence of other injury. ?Neurologic: Alert & oriented x 3, no focal neuro deficits ?Psychiatric: Speech and behavior appropriate ? ? ?ED Course ? ? ?Medications - No data to display ? ?Orders Placed This Encounter  ?Procedures  ? DG Finger Middle Left  ?  Standing Status:   Standing  ?  Number of Occurrences:   1  ?  Order Specific Question:   Reason for Exam (SYMPTOM  OR DIAGNOSIS REQUIRED)  ?  Answer:   Trauma 2.5 weeks ago, tenderness at the PIP, middle phalanx rule out fracture  ? Apply other splint  ?  Middle finger  ?  Standing Status:   Standing  ?  Number of Occurrences:   1  ?  Order Specific Question:   Laterality  ?  Answer:   Left  ?  Order Specific Question:   Splint type  ?  Answer:   Finger  ? ? ?No results found for this or any previous visit (from the past 24 hour(s)). ?DG Finger Middle Left ? ?Result Date: 05/17/2021 ?CLINICAL DATA:  Trauma 2 weeks ago, pain EXAM: LEFT MIDDLE FINGER 2+V  COMPARISON:  None. FINDINGS: No fracture or dislocation is seen. There is soft tissue swelling over the PIP joint. There are no radiopaque foreign bodies. IMPRESSION: No fracture or dislocation is seen in the left middle finger. Electronically Signed   By: Ernie Avena M.D.   On: 05/17/2021 09:51   ? ?ED Clinical Impression ? ?1. Contusion of left middle finger without damage to nail, initial encounter   ?  ? ?ED Assessment/Plan ? ? ?Reviewed imaging independently.  Soft tissue swelling over the PIP.  No fracture, dislocation.  See radiology report for full details. ? ?Patient with a contusion of the finger.  There is no fracture or dislocation seen on x-ray.  Will place in a splint for comfort.  Tylenol/ibuprofen together 3-4 times a day.   Follow-up with hand at [facility] if not better in a week. ? ?Discussed imaging, MDM, treatment plan, and plan for follow-up with patient. patient agrees with plan.  ? ?Meds ordered this encounter  ?Medications  ? ibuprofen (ADVIL) 600 MG tablet  ?  Sig: Take 1 tablet (600 mg total) by mouth every 6 (six) hours as needed.  ?  Dispense:  30 tablet  ?  Refill:  0  ? ? ? ? ?*This clinic note was created using Scientist, clinical (histocompatibility and immunogenetics). Therefore, there may be occasional mistakes despite careful proofreading. ? ?? ? ?  ?Domenick Gong, MD ?05/17/21 1204 ? ?

## 2021-05-17 NOTE — Discharge Instructions (Addendum)
Your x-ray was negative for fracture or dislocation.  I suspect you have a bad contusion of your finger.  I am placing it in a splint to help immobilize it and help it heal.  Take 600 mg of ibuprofen combined with 1000 mg of Tylenol 3-4 times a day as needed for pain.  Follow-up with [facility] at their walk-in clinic or with one of their providers if not better in a week to 10 days. ?

## 2023-01-26 NOTE — Progress Notes (Addendum)
 Subjective:  George Butler is a 42 y.o. y.o. male who presents for evaluation of influenza like symptoms. Symptoms include chest pain during cough, chills, dry cough, headache, myalgias, shortness of breath, back pain with coughing, vomiting and diarrhea, and fever and have been present for TODAY day. George Butler has tried to alleviate the symptoms with Mucinex  and DayQuil with no relief. High risk factors for influenza complications: History of smoking.  Patient persistent complaint of feeling very bad.   Allergies  Allergen Reactions   Codeine Hives   Fish Containing Products Hives and Swelling    Facial swelling    Review of Systems Pertinent items are noted in HPI.     Objective:   BP 143/88   Pulse 78   Temp 99.5 F (37.5 C) (Tympanic)   Resp 18   Ht 1.778 m (5' 10)   Wt 95.3 kg (210 lb)   SpO2 96%   BMI 30.13 kg/m  General appearance: alert, appears stated age, cooperative, fatigued, mild distress, moderately obese, and ill-appearing with difficulty cooperating to exam although he is cooperative, he was a daughter who is helpful and here with his wife who appears a bit distant Head: Normocephalic, without obvious abnormality, atraumatic, sinuses tender to percussion Eyes: PERRLA EOMI Ears: normal TM's and external ear canals both ears Nose: copious, mucoid, and purulent discharge, moderate congestion, no sinus tenderness Throat: lips, mucosa, and tongue normal; teeth and gums normal Neck: no JVD, supple, symmetrical, trachea midline, thyroid not enlarged, symmetric, no tenderness/mass/nodules, and mild anterior and posterior cervical nodes, worse on the left side Lungs: clear to auscultation bilaterally Heart: regular rate and rhythm, S1, S2 normal, no murmur, click, rub or gallop Abdomen: soft, non-tender; bowel sounds normal; no masses,  no organomegaly, difficult exam since patient was not able to lay down due to dizziness Skin: Skin color, texture, turgor normal. No  rashes or lesions Lymph nodes: Cervical, supraclavicular, and axillary nodes normal. Neurologic: Difficulty with ambulation due to illness and had to be assisted by his daughter to sit on the exam table  Results for orders placed or performed in visit on 01/25/23  POC Influenza A&B NAT (IDNOW)  Result Value Ref Range   Influenza A RNA Negative Negative   Influenza B RNA Negative Negative  POC SARS-COV-2 SYMPTOMATIC (IDNOW)  Result Value Ref Range   SARS-COV-2 IDNOW Negative Negative   SARS IDNOW Information      A rapid, molecular diagnostic test on the IDNOW.  Negative results should be treated as presumptive and, if inconsistent with clinical symptoms should be confirmed with an alternative molecular assay.      Assessment: 1. SOB (shortness of breath)  POC Influenza A&B NAT (IDNOW)   POC SARS-COV-2 SYMPTOMATIC (IDNOW)    2. Acute viral syndrome  POC Influenza A&B NAT (IDNOW)   POC SARS-COV-2 SYMPTOMATIC (IDNOW)        Plan:   Supportive care with appropriate antipyretics and fluids. Obtain labs per orders. Educational material distributed and questions answered. Discussed shortness of breath and acute viral syndrome with patient and his family.  Consider influenza and COVID-19 infection.  Testing accordingly.  Testing is negative.  Stressed that he does appear significantly ill.  Unsure etiology but stressed he needs more definitive evaluation management by the ER for accurate management.  His wife does state they live in [place] and will follow-up in the local ER in [place].  Stressed the patient needs ER follow-up tonight.  Offered Advil  for comfort but his wife  declines.   Guss Aye, PA-C Sun 01/26/2023,1:01 AM

## 2023-02-23 ENCOUNTER — Other Ambulatory Visit: Payer: Self-pay

## 2023-02-23 ENCOUNTER — Emergency Department (HOSPITAL_COMMUNITY)
Admission: EM | Admit: 2023-02-23 | Discharge: 2023-02-23 | Discharge status: Against Medical Advice | Payer: Commercial Managed Care - PPO | Attending: Emergency Medicine | Admitting: Emergency Medicine

## 2023-02-23 ENCOUNTER — Encounter (HOSPITAL_COMMUNITY): Payer: Self-pay

## 2023-02-23 DIAGNOSIS — R3911 Hesitancy of micturition: Secondary | ICD-10-CM | POA: Diagnosis not present

## 2023-02-23 DIAGNOSIS — R339 Retention of urine, unspecified: Secondary | ICD-10-CM | POA: Insufficient documentation

## 2023-02-23 DIAGNOSIS — R072 Precordial pain: Secondary | ICD-10-CM | POA: Insufficient documentation

## 2023-02-23 DIAGNOSIS — M549 Dorsalgia, unspecified: Secondary | ICD-10-CM | POA: Diagnosis not present

## 2023-02-23 DIAGNOSIS — E860 Dehydration: Secondary | ICD-10-CM | POA: Diagnosis not present

## 2023-02-23 DIAGNOSIS — R519 Headache, unspecified: Secondary | ICD-10-CM | POA: Diagnosis not present

## 2023-02-23 DIAGNOSIS — Z5321 Procedure and treatment not carried out due to patient leaving prior to being seen by health care provider: Secondary | ICD-10-CM | POA: Diagnosis not present

## 2023-02-23 DIAGNOSIS — R0602 Shortness of breath: Secondary | ICD-10-CM | POA: Diagnosis not present

## 2023-02-23 DIAGNOSIS — R509 Fever, unspecified: Secondary | ICD-10-CM | POA: Diagnosis not present

## 2023-02-23 DIAGNOSIS — R1030 Lower abdominal pain, unspecified: Secondary | ICD-10-CM | POA: Diagnosis present

## 2023-02-23 LAB — URINALYSIS, ROUTINE W REFLEX MICROSCOPIC
Bilirubin Urine: NEGATIVE
Glucose, UA: NEGATIVE mg/dL
Hgb urine dipstick: NEGATIVE
Ketones, ur: NEGATIVE mg/dL
Leukocytes,Ua: NEGATIVE
Nitrite: NEGATIVE
Protein, ur: NEGATIVE mg/dL
Specific Gravity, Urine: 1.032 — ABNORMAL HIGH (ref 1.005–1.030)
pH: 5 (ref 5.0–8.0)

## 2023-02-23 LAB — CBC WITH DIFFERENTIAL/PLATELET
Abs Immature Granulocytes: 0.03 10*3/uL (ref 0.00–0.07)
Basophils Absolute: 0 10*3/uL (ref 0.0–0.1)
Basophils Relative: 0 %
Eosinophils Absolute: 0 10*3/uL (ref 0.0–0.5)
Eosinophils Relative: 0 %
HCT: 47 % (ref 39.0–52.0)
Hemoglobin: 15.4 g/dL (ref 13.0–17.0)
Immature Granulocytes: 1 %
Lymphocytes Relative: 56 %
Lymphs Abs: 3.3 10*3/uL (ref 0.7–4.0)
MCH: 27.7 pg (ref 26.0–34.0)
MCHC: 32.8 g/dL (ref 30.0–36.0)
MCV: 84.7 fL (ref 80.0–100.0)
Monocytes Absolute: 0.6 10*3/uL (ref 0.1–1.0)
Monocytes Relative: 11 %
Neutro Abs: 1.8 10*3/uL (ref 1.7–7.7)
Neutrophils Relative %: 32 %
Platelets: 227 10*3/uL (ref 150–400)
RBC: 5.55 MIL/uL (ref 4.22–5.81)
RDW: 13.2 % (ref 11.5–15.5)
WBC: 5.8 10*3/uL (ref 4.0–10.5)
nRBC: 0 % (ref 0.0–0.2)

## 2023-02-23 LAB — BASIC METABOLIC PANEL
Anion gap: 14 (ref 5–15)
BUN: 11 mg/dL (ref 6–20)
CO2: 24 mmol/L (ref 22–32)
Calcium: 9.7 mg/dL (ref 8.9–10.3)
Chloride: 102 mmol/L (ref 98–111)
Creatinine, Ser: 0.97 mg/dL (ref 0.61–1.24)
GFR, Estimated: >60 mL/min (ref >60)
Glucose, Bld: 105 mg/dL — ABNORMAL HIGH (ref 70–99)
Potassium: 3.9 mmol/L (ref 3.5–5.1)
Sodium: 140 mmol/L (ref 135–145)

## 2023-02-23 NOTE — ED Provider Triage Note (Signed)
Emergency Medicine Provider Triage Evaluation Note  George Butler , a 43 y.o. male  was evaluated in triage.  Pt complains of urinary retention x 3-4 days. Says this started after being Dx w/ flu. States he dribbles but has not been able to empty bladder completely. Now having increased abdominal pain and bilateral flank pain.   Endorses fever, headache, occasional pleuritic chest pain accompanied by shortness of breath, and suprapubic pain. Able to ambulate without difficulty.  Denies n/v, LE swelling.   Review of Systems  Positive: See above Negative: See above  Physical Exam  BP (!) 155/105 (BP Location: Right Arm)   Pulse 83   Temp 98.3 F (36.8 C) (Oral)   Resp 20   Ht 5\' 7"  (1.702 m)   Wt 74.8 kg   SpO2 100%   BMI 25.84 kg/m  Gen:   Awake, no distress   Resp:  Normal effort  MSK:   Moves extremities without difficulty  Other:    Medical Decision Making  Medically screening exam initiated at 6:02 PM.  Appropriate orders placed.  George Butler was informed that the remainder of the evaluation will be completed by another provider, this initial triage assessment does not replace that evaluation, and the importance of remaining in the ED until their evaluation is complete.     Lunette Stands, [place] 02/23/23 1807

## 2023-02-23 NOTE — ED Triage Notes (Signed)
Reports recently had the flu and since has not been able to urinate and only dribbling.  Reports bilateral flank and  lower back pain.  Reports still having fever today of 101.3

## 2023-02-24 ENCOUNTER — Other Ambulatory Visit: Payer: Self-pay

## 2023-02-24 ENCOUNTER — Emergency Department (HOSPITAL_BASED_OUTPATIENT_CLINIC_OR_DEPARTMENT_OTHER): Payer: Managed Care, Other (non HMO)

## 2023-02-24 ENCOUNTER — Encounter (HOSPITAL_BASED_OUTPATIENT_CLINIC_OR_DEPARTMENT_OTHER): Payer: Self-pay | Admitting: Emergency Medicine

## 2023-02-24 ENCOUNTER — Emergency Department (HOSPITAL_BASED_OUTPATIENT_CLINIC_OR_DEPARTMENT_OTHER)
Admission: EM | Admit: 2023-02-24 | Discharge: 2023-02-24 | Disposition: A | Discharge status: Home / Self Care | Payer: Commercial Managed Care - PPO | Attending: Emergency Medicine | Admitting: Emergency Medicine

## 2023-02-24 DIAGNOSIS — R399 Unspecified symptoms and signs involving the genitourinary system: Secondary | ICD-10-CM

## 2023-02-24 DIAGNOSIS — R6889 Other general symptoms and signs: Secondary | ICD-10-CM

## 2023-02-24 DIAGNOSIS — E860 Dehydration: Secondary | ICD-10-CM

## 2023-02-24 LAB — URINALYSIS, W/ REFLEX TO CULTURE (INFECTION SUSPECTED)
Bilirubin Urine: NEGATIVE
Glucose, UA: NEGATIVE mg/dL
Hgb urine dipstick: NEGATIVE
Ketones, ur: 15 mg/dL — AB
Leukocytes,Ua: NEGATIVE
Nitrite: NEGATIVE
Specific Gravity, Urine: 1.039 — ABNORMAL HIGH (ref 1.005–1.030)
pH: 5.5 (ref 5.0–8.0)

## 2023-02-24 LAB — CBC WITH DIFFERENTIAL/PLATELET
Abs Immature Granulocytes: 0.01 10*3/uL (ref 0.00–0.07)
Basophils Absolute: 0 10*3/uL (ref 0.0–0.1)
Basophils Relative: 0 %
Eosinophils Absolute: 0 10*3/uL (ref 0.0–0.5)
Eosinophils Relative: 0 %
HCT: 41.9 % (ref 39.0–52.0)
Hemoglobin: 13.6 g/dL (ref 13.0–17.0)
Immature Granulocytes: 0 %
Lymphocytes Relative: 53 %
Lymphs Abs: 3.1 10*3/uL (ref 0.7–4.0)
MCH: 27.6 pg (ref 26.0–34.0)
MCHC: 32.5 g/dL (ref 30.0–36.0)
MCV: 85.2 fL (ref 80.0–100.0)
Monocytes Absolute: 0.6 10*3/uL (ref 0.1–1.0)
Monocytes Relative: 11 %
Neutro Abs: 2.1 10*3/uL (ref 1.7–7.7)
Neutrophils Relative %: 36 %
Platelets: 200 10*3/uL (ref 150–400)
RBC: 4.92 MIL/uL (ref 4.22–5.81)
RDW: 13.3 % (ref 11.5–15.5)
WBC: 5.8 10*3/uL (ref 4.0–10.5)
nRBC: 0 % (ref 0.0–0.2)

## 2023-02-24 LAB — COMPREHENSIVE METABOLIC PANEL
ALT: 23 U/L (ref 0–44)
AST: 24 U/L (ref 15–41)
Albumin: 5.2 g/dL — ABNORMAL HIGH (ref 3.5–5.0)
Alkaline Phosphatase: 61 U/L (ref 38–126)
Anion gap: 12 (ref 5–15)
BUN: 12 mg/dL (ref 6–20)
CO2: 27 mmol/L (ref 22–32)
Calcium: 9.7 mg/dL (ref 8.9–10.3)
Chloride: 100 mmol/L (ref 98–111)
Creatinine, Ser: 0.94 mg/dL (ref 0.61–1.24)
GFR, Estimated: >60 mL/min (ref >60)
Glucose, Bld: 93 mg/dL (ref 70–99)
Potassium: 3.5 mmol/L (ref 3.5–5.1)
Sodium: 139 mmol/L (ref 135–145)
Total Bilirubin: 0.5 mg/dL (ref 0.0–1.2)
Total Protein: 8.8 g/dL — ABNORMAL HIGH (ref 6.5–8.1)

## 2023-02-24 LAB — LIPASE, BLOOD: Lipase: 18 U/L (ref 11–51)

## 2023-02-24 MED ORDER — SODIUM CHLORIDE 0.9 % IV BOLUS
1000.0000 mL | Freq: Once | INTRAVENOUS | Status: AC
  Administered 2023-02-24: 1000 mL via INTRAVENOUS

## 2023-02-24 MED ORDER — KETOROLAC TROMETHAMINE 30 MG/ML IJ SOLN
30.0000 mg | Freq: Once | INTRAMUSCULAR | Status: AC
  Administered 2023-02-24: 30 mg via INTRAVENOUS
  Filled 2023-02-24: qty 1

## 2023-02-24 MED ORDER — OXYCODONE-ACETAMINOPHEN 5-325 MG PO TABS
1.0000 | ORAL_TABLET | ORAL | Status: AC | PRN
  Administered 2023-02-24 (×2): 1 via ORAL
  Filled 2023-02-24 (×2): qty 1

## 2023-02-24 NOTE — ED Provider Notes (Signed)
[place] EMERGENCY DEPARTMENT AT [facility]  Provider Note  CSN: [id number] Arrival date & time: 02/23/23 2350  History Chief Complaint  Patient presents with   Abdominal Pain   Back Pain    George Butler is a 43 y.o. male with recent flu-like illness, here for evaluation of myalgias, lower abdominal pain and urinary hesitancy. He was at [facility] around 1700hrs and had labs done which were unremarkable, but left before being seen and came here. No vomiting in recent days.    Home Medications Prior to Admission medications   Medication Sig Start Date End Date Taking? Authorizing Provider  ibuprofen (ADVIL) 600 MG tablet Take 1 tablet (600 mg total) by mouth every 6 (six) hours as needed. 05/17/21   Domenick Gong, MD     Allergies    Codeine, Fish allergy, and Shellfish allergy   Review of Systems   Review of Systems Please see HPI for pertinent positives and negatives  Physical Exam BP (!) 145/89   Pulse 64   Temp 97.9 F (36.6 C) (Oral)   Resp (!) 22   Ht 5\' 7"  (1.702 m)   Wt 74 kg   SpO2 98%   BMI 25.55 kg/m   Physical Exam Vitals and nursing note reviewed.  Constitutional:      Appearance: Normal appearance.  HENT:     Head: Normocephalic and atraumatic.     Nose: Nose normal.     Mouth/Throat:     Mouth: Mucous membranes are moist.  Eyes:     Extraocular Movements: Extraocular movements intact.     Conjunctiva/sclera: Conjunctivae normal.  Cardiovascular:     Rate and Rhythm: Normal rate.  Pulmonary:     Effort: Pulmonary effort is normal.     Breath sounds: Normal breath sounds.  Abdominal:     General: Abdomen is flat.     Palpations: Abdomen is soft.     Tenderness: There is generalized abdominal tenderness. There is no guarding. Negative signs include Murphy's sign and McBurney's sign.  Musculoskeletal:        General: No swelling. Normal range of motion.     Cervical back: Neck supple.  Skin:    General: Skin is warm and  dry.  Neurological:     General: No focal deficit present.     Mental Status: He is alert.  Psychiatric:        Mood and Affect: Mood normal.     ED Results / Procedures / Treatments   EKG None  Procedures Procedures  Medications Ordered in the ED Medications  oxyCODONE-acetaminophen (PERCOCET/ROXICET) 5-325 MG per tablet 1 tablet (1 tablet Oral Given 02/24/23 0343)  sodium chloride 0.9 % bolus 1,000 mL (1,000 mLs Intravenous New Bag/Given 02/24/23 0334)  ketorolac (TORADOL) 30 MG/ML injection 30 mg (30 mg Intravenous Given 02/24/23 0436)    Initial Impression and Plan  Patient here with persistent lower abdomen and low back pain as well as urinary symptoms. Labs done a [facility] about 10-12 hours ago were unremarkable but given persistent symptoms, will recheck them now. IVF for perceived dehydration, decreased urine output.   ED Course   Clinical Course as of 02/24/23 0541  Mon Feb 24, 2023  0413 CMP and lipase are normal. Will give some toradol for pain. UA concentrated, no chemical signs of infection, some mild blood. Consider renal stone. Will send for CT.  [CS]  0445 CBC is normal.  [CS]  0539 I personally viewed the images from radiology  studies and agree with radiologist interpretation: CT is negative. Patient reports symptoms are resolved and he is ready to go home. Recommend continued oral hydration, rest, symptomatic care and PCP follow up, RTED for any other concerns.   [CS]    Clinical Course User Index [CS] Pollyann Savoy, MD     MDM Rules/Calculators/A&P Medical Decision Making Problems Addressed: Dehydration: acute illness or injury Flu-like symptoms: acute illness or injury Lower urinary tract symptoms (LUTS): acute illness or injury  Amount and/or Complexity of Data Reviewed Labs: ordered. Decision-making details documented in ED Course. Radiology: ordered and independent interpretation performed. Decision-making details documented in ED  Course.  Risk Prescription drug management.     Final Clinical Impression(s) / ED Diagnoses Final diagnoses:  Flu-like symptoms  Dehydration  Lower urinary tract symptoms (LUTS)    Rx / DC Orders ED Discharge Orders     None        Pollyann Savoy, MD 02/24/23 [phone]

## 2023-02-24 NOTE — ED Notes (Signed)
To CT

## 2023-02-24 NOTE — ED Triage Notes (Signed)
Pt to ED c/o bilateral lower back pain for several days radiating to abd.  States recently had flu (+ home test) and has been dehydrated and unable to produce much urine.  Pt was at [facility] and had blood work and urine done with results in [facility] but the wait was too long so he came here.

## 2023-06-17 IMAGING — DX DG FINGER MIDDLE 2+V*L*
3 series · 3 of 3 positions shown · non-contrast
Comparison: None.

CLINICAL DATA: Trauma 2 weeks ago, pain

EXAM:
LEFT MIDDLE FINGER 2+V

[finger ap]
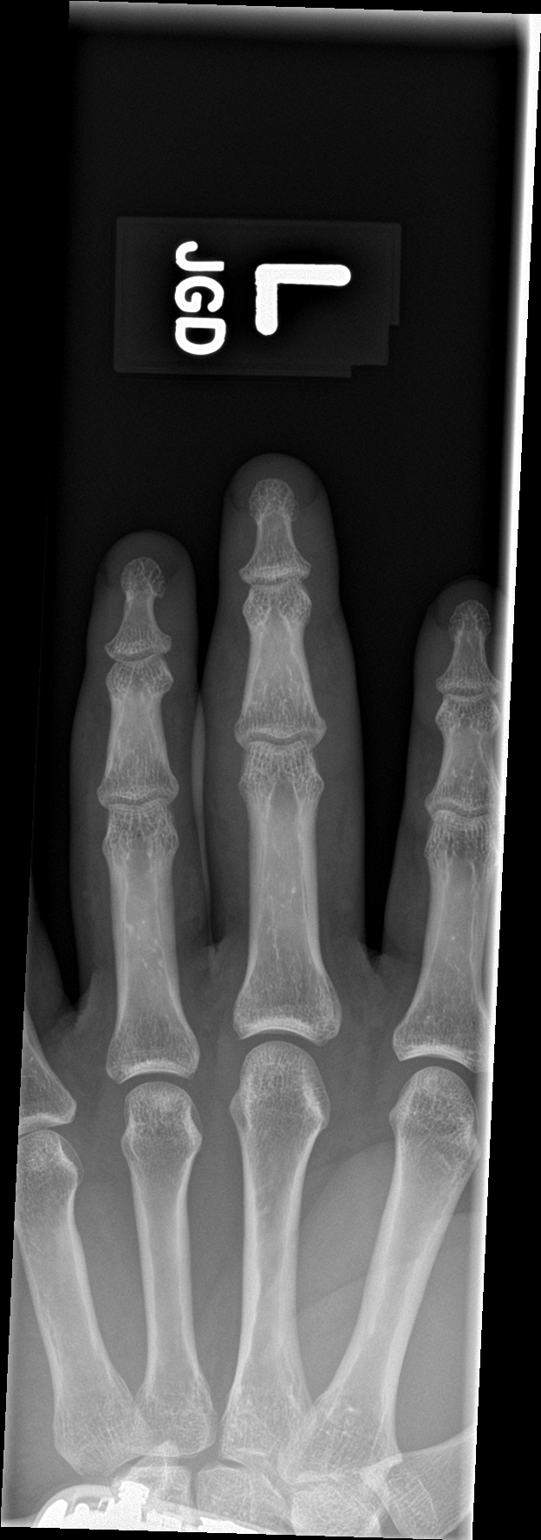

[finger obl]
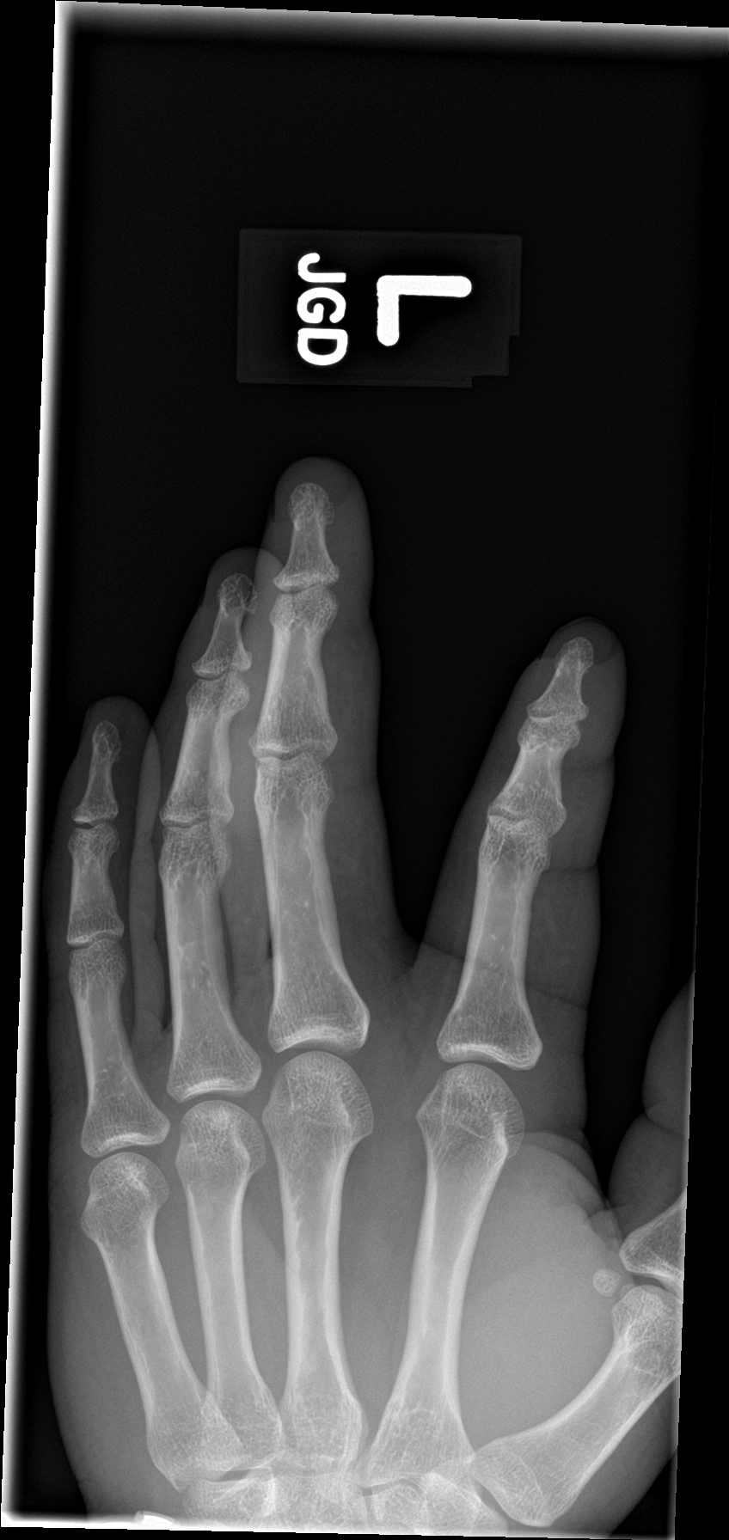

[finger lat]
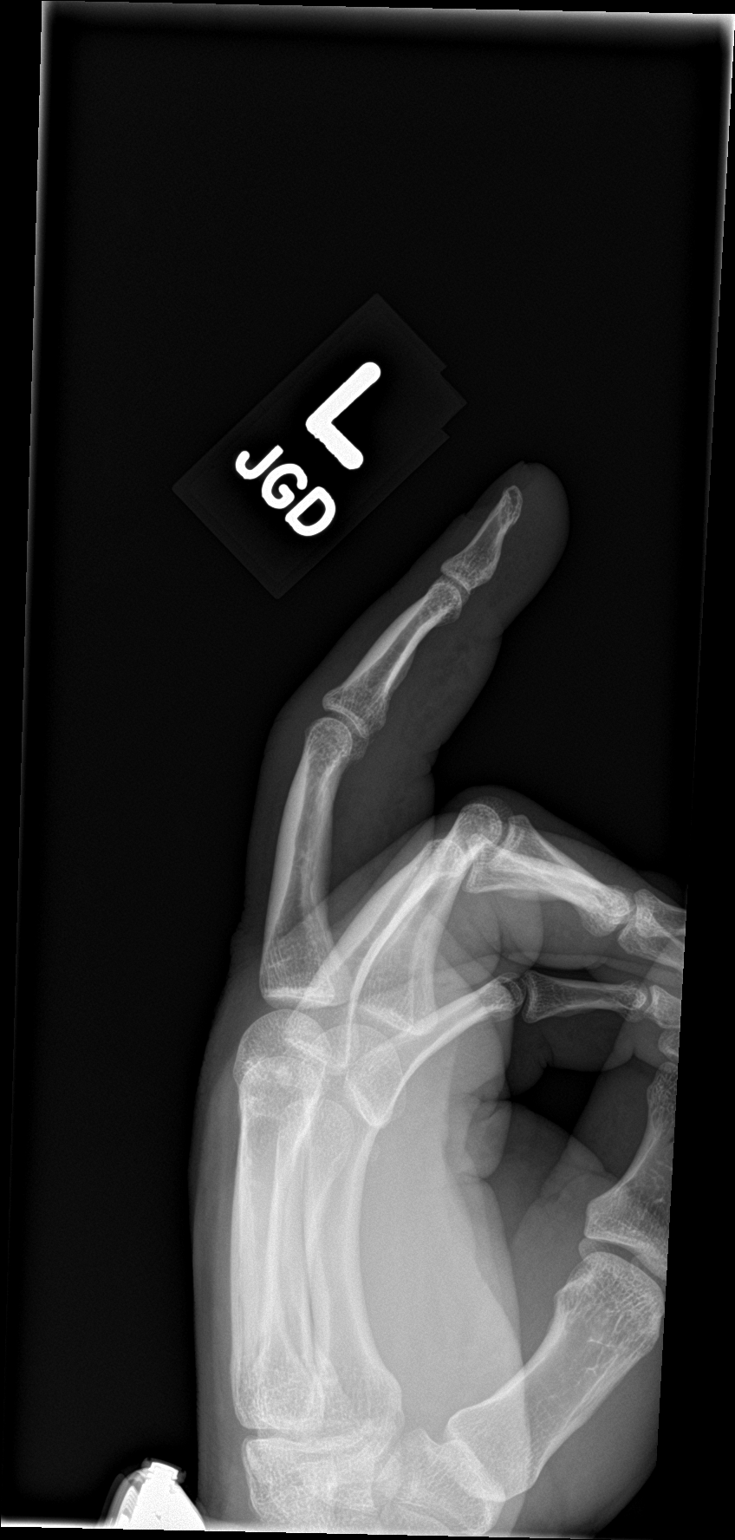

[3 of 3 positions shown; findings below may reference images not displayed]

FINDINGS: No fracture or dislocation is seen. There is soft tissue swelling
over the PIP joint. There are no radiopaque foreign bodies.
IMPRESSION: No fracture or dislocation is seen in the left middle finger.

## 2024-02-23 ENCOUNTER — Encounter (HOSPITAL_COMMUNITY): Payer: Self-pay | Admitting: Emergency Medicine

## 2024-02-23 ENCOUNTER — Emergency Department (HOSPITAL_COMMUNITY)
Admission: EM | Admit: 2024-02-23 | Discharge: 2024-02-23 | Disposition: A | Discharge status: Home / Self Care | Payer: Self-pay | Attending: Emergency Medicine | Admitting: Emergency Medicine

## 2024-02-23 ENCOUNTER — Emergency Department (HOSPITAL_COMMUNITY): Payer: Self-pay

## 2024-02-23 DIAGNOSIS — R059 Cough, unspecified: Secondary | ICD-10-CM | POA: Insufficient documentation

## 2024-02-23 DIAGNOSIS — R630 Anorexia: Secondary | ICD-10-CM | POA: Insufficient documentation

## 2024-02-23 DIAGNOSIS — R61 Generalized hyperhidrosis: Secondary | ICD-10-CM | POA: Insufficient documentation

## 2024-02-23 DIAGNOSIS — R0602 Shortness of breath: Secondary | ICD-10-CM | POA: Insufficient documentation

## 2024-02-23 LAB — CBC WITH DIFFERENTIAL/PLATELET
Abs Immature Granulocytes: 0.01 10*3/uL (ref 0.00–0.07)
Basophils Absolute: 0 10*3/uL (ref 0.0–0.1)
Basophils Relative: 0 %
Eosinophils Absolute: 0.1 10*3/uL (ref 0.0–0.5)
Eosinophils Relative: 2 %
HCT: 44 % (ref 39.0–52.0)
Hemoglobin: 14.6 g/dL (ref 13.0–17.0)
Immature Granulocytes: 0 %
Lymphocytes Relative: 41 %
Lymphs Abs: 3.3 10*3/uL (ref 0.7–4.0)
MCH: 28.2 pg (ref 26.0–34.0)
MCHC: 33.2 g/dL (ref 30.0–36.0)
MCV: 85.1 fL (ref 80.0–100.0)
Monocytes Absolute: 0.9 10*3/uL (ref 0.1–1.0)
Monocytes Relative: 12 %
Neutro Abs: 3.6 10*3/uL (ref 1.7–7.7)
Neutrophils Relative %: 45 %
Platelets: 258 10*3/uL (ref 150–400)
RBC: 5.17 MIL/uL (ref 4.22–5.81)
RDW: 13.4 % (ref 11.5–15.5)
WBC: 7.9 10*3/uL (ref 4.0–10.5)
nRBC: 0 % (ref 0.0–0.2)

## 2024-02-23 LAB — RESP PANEL BY RT-PCR (RSV, FLU A&B, COVID)  RVPGX2
Influenza A by PCR: NEGATIVE
Influenza B by PCR: NEGATIVE
Resp Syncytial Virus by PCR: NEGATIVE
SARS Coronavirus 2 by RT PCR: NEGATIVE

## 2024-02-23 LAB — COMPREHENSIVE METABOLIC PANEL WITH GFR
ALT: 38 U/L (ref 0–44)
AST: 51 U/L — ABNORMAL HIGH (ref 15–41)
Albumin: 4.5 g/dL (ref 3.5–5.0)
Alkaline Phosphatase: 78 U/L (ref 38–126)
Anion gap: 14 (ref 5–15)
BUN: 10 mg/dL (ref 6–20)
CO2: 21 mmol/L — ABNORMAL LOW (ref 22–32)
Calcium: 9.8 mg/dL (ref 8.9–10.3)
Chloride: 106 mmol/L (ref 98–111)
Creatinine, Ser: 1 mg/dL (ref 0.61–1.24)
GFR, Estimated: >60 mL/min
Glucose, Bld: 113 mg/dL — ABNORMAL HIGH (ref 70–99)
Potassium: 3.8 mmol/L (ref 3.5–5.1)
Sodium: 141 mmol/L (ref 135–145)
Total Bilirubin: 0.3 mg/dL (ref 0.0–1.2)
Total Protein: 7.7 g/dL (ref 6.5–8.1)

## 2024-02-23 LAB — BLOOD GAS, VENOUS
Acid-Base Excess: 1.2 mmol/L (ref 0.0–2.0)
Bicarbonate: 22.6 mmol/L (ref 20.0–28.0)
O2 Saturation: 94.6 %
Patient temperature: 37
pCO2, Ven: 27 mmHg — ABNORMAL LOW (ref 44–60)
pH, Ven: 7.53 — ABNORMAL HIGH (ref 7.25–7.43)
pO2, Ven: 62 mmHg — ABNORMAL HIGH (ref 32–45)

## 2024-02-23 LAB — CBG MONITORING, ED: Glucose-Capillary: 115 mg/dL — ABNORMAL HIGH (ref 70–99)

## 2024-02-23 LAB — PRO BRAIN NATRIURETIC PEPTIDE: Pro Brain Natriuretic Peptide: <50 pg/mL

## 2024-02-23 LAB — TROPONIN T, HIGH SENSITIVITY: Troponin T High Sensitivity: <6 ng/L (ref 0–19)

## 2024-02-23 MED ORDER — CELECOXIB 200 MG PO CAPS: 200.0000 mg | ORAL_CAPSULE | Freq: Two times a day (BID) | ORAL | 0 refills | Status: AC

## 2024-02-23 MED ORDER — IOHEXOL 350 MG/ML SOLN
75.0000 mL | Freq: Once | INTRAVENOUS | Status: AC | PRN
  Administered 2024-02-23: 75 mL via INTRAVENOUS

## 2024-02-23 NOTE — Discharge Instructions (Signed)
 You have been seen and discharged from the emergency department.  Your heart and lung workup was normal.  You may be experiencing discomfort from your chest wall/musculoskeletal.  Take anti-inflammatory pain medicine as directed.  Follow-up with your primary provider for further evaluation and further care. Take home medications as prescribed. If you have any worsening symptoms or further concerns for your health please return to an emergency department for further evaluation.

## 2024-02-23 NOTE — ED Provider Notes (Signed)
 " [place] EMERGENCY DEPARTMENT AT [facility] Provider Note   CSN: [id number] Arrival date & time: 02/23/24  9190     Patient presents with: Shortness of Breath   Aniket Paye is a 44 y.o. male.   HPI   44 year old male presents emergency department with worsening shortness of breath, starting last night.  Patient endorses productive cough of green phlegm, sporadically blood.  Denies any fever or acute chest pain.  States he had a decreased appetite but no vomiting or diarrhea.  No recent sick contacts.  Denies any leg swelling or history of DVT/PE.  Does reveal history of a spontaneously collapsed lung when he was 44 years old.  Prior to Admission medications  Not on File    Allergies: Codeine, Fish allergy, and Shellfish allergy    Review of Systems  Constitutional:  Positive for fatigue. Negative for fever.  Respiratory:  Positive for cough and shortness of breath.   Cardiovascular:  Negative for chest pain, palpitations and leg swelling.  Gastrointestinal:  Negative for abdominal pain, diarrhea and vomiting.  Musculoskeletal:  Negative for back pain.  Skin:  Negative for rash.  Neurological:  Negative for headaches.    Updated Vital Signs There were no vitals taken for this visit.  Physical Exam Vitals and nursing note reviewed.  Constitutional:      Appearance: Normal appearance. He is diaphoretic.  HENT:     Head: Normocephalic.     Mouth/Throat:     Mouth: Mucous membranes are moist.  Cardiovascular:     Rate and Rhythm: Normal rate.  Pulmonary:     Effort: Pulmonary effort is normal. Tachypnea present. No accessory muscle usage.     Breath sounds: No decreased breath sounds, wheezing or rales.  Abdominal:     Palpations: Abdomen is soft.  Musculoskeletal:     Right lower leg: No edema.     Left lower leg: No edema.  Skin:    General: Skin is warm.  Neurological:     Mental Status: He is alert and oriented to person, place, and  time. Mental status is at baseline.  Psychiatric:        Mood and Affect: Mood normal.     (all labs ordered are listed, but only abnormal results are displayed) Labs Reviewed  COMPREHENSIVE METABOLIC PANEL WITH GFR - Abnormal; Notable for the following components:      Result Value   CO2 21 (*)    Glucose, Bld 113 (*)    AST 51 (*)    All other components within normal limits  BLOOD GAS, VENOUS - Abnormal; Notable for the following components:   pH, Ven 7.53 (*)    pCO2, Ven 27 (*)    pO2, Ven 62 (*)    All other components within normal limits  CBG MONITORING, ED - Abnormal; Notable for the following components:   Glucose-Capillary 115 (*)    All other components within normal limits  RESP PANEL BY RT-PCR (RSV, FLU A&B, COVID)  [id number]  CBC WITH DIFFERENTIAL/PLATELET  PRO BRAIN NATRIURETIC PEPTIDE  TROPONIN T, HIGH SENSITIVITY    EKG: EKG Interpretation Date/Time:  Monday February 23 2024 09:01:03 EST Ventricular Rate:  66 PR Interval:  168 QRS Duration:  76 QT Interval:  379 QTC Calculation: 397 R Axis:   69  Text Interpretation: Sinus rhythm RSR' in V1 or V2, probably normal variant Confirmed by Bari Flank [phone]) on 02/23/2024 9:07:00 AM  Radiology: [facility] Chest Portable 1 View  Result Date: 02/23/2024 EXAM: 1 VIEW(S) XRAY OF THE CHEST 02/23/2024 08:37:00 AM COMPARISON: 05/25/2015 CLINICAL HISTORY: Shortness of breath. FINDINGS: LUNGS AND PLEURA: Lung volumes are low. No focal pulmonary opacity. No pleural effusion. No pneumothorax. HEART AND MEDIASTINUM: No acute abnormality of the cardiac and mediastinal silhouettes. BONES AND SOFT TISSUES: No acute osseous abnormality. IMPRESSION: 1. Low lung volumes. 2. No acute findings. Electronically signed by: Waddell Calk MD 02/23/2024 08:39 AM EST RP Workstation: [id number]     Procedures   Medications Ordered in the ED - No data to display                                  Medical Decision Making Amount and/or  Complexity of Data Reviewed Labs: ordered. Radiology: ordered.  Risk Prescription drug management.   44 year old male presents emergency department with concern for shortness of breath.  Initially very tachypneic with respiratory rate in the 30s but stable oxygen.  Otherwise normal vitals, afebrile.  Equal breath sounds on exam.  Chest x-ray shows no acute finding.  Blood work is reassuring, cardiac workup is negative.  EKG is baseline.  Respiratory panel negative.  Tachypnea and discomfort continued so CT PE study was performed to rule out pulmonary embolism.  This is negative.  On reevaluation patient feels improved, prior to me entering the room his respiratory rate was 16.  He still feels some discomfort with deep breaths, could be musculoskeletal/chest wall.  Will treat as such and plan for outpatient follow-up.  Patient at this time appears safe and stable for discharge and close outpatient follow up. Discharge plan and strict return to ED precautions discussed, patient verbalizes understanding and agreement.     Final diagnoses:  None    ED Discharge Orders     None          Bari Roxie HERO, DO 02/23/24 1453  "

## 2024-02-23 NOTE — ED Triage Notes (Signed)
 Pt arriving POV with [facility], pt has hx of collapsed lung. Pt reports this started last night, no use of inhalers or oxygen on a regular basis. Pt diaphoretic upon assessment.

## 2024-02-23 NOTE — ED Notes (Signed)
EKG given to Dr.Horton. °
